# Patient Record
Sex: Female | Born: 1985 | Hispanic: Yes | Marital: Single | Smoking: Never smoker
Health system: Southern US, Community
[De-identification: ages and names within clinical notes are randomized; demographics above are authoritative.]

## PROBLEM LIST (undated history)

## (undated) ENCOUNTER — Inpatient Hospital Stay (HOSPITAL_COMMUNITY): Payer: Self-pay

## (undated) DIAGNOSIS — Z789 Other specified health status: Secondary | ICD-10-CM

## (undated) DIAGNOSIS — D649 Anemia, unspecified: Secondary | ICD-10-CM

## (undated) DIAGNOSIS — Z5189 Encounter for other specified aftercare: Secondary | ICD-10-CM

## (undated) HISTORY — DX: Anemia, unspecified: D64.9

## (undated) HISTORY — DX: Other specified health status: Z78.9

## (undated) HISTORY — PX: DILATION AND CURETTAGE OF UTERUS: SHX78

## (undated) HISTORY — DX: Encounter for other specified aftercare: Z51.89

---

## 2014-04-21 ENCOUNTER — Ambulatory Visit (INDEPENDENT_AMBULATORY_CARE_PROVIDER_SITE_OTHER): Payer: Medicaid Other | Admitting: Obstetrics & Gynecology

## 2014-04-21 ENCOUNTER — Encounter: Payer: Self-pay | Admitting: Obstetrics & Gynecology

## 2014-04-21 VITALS — BP 121/78 | HR 91 | Temp 97.9°F | Wt 183.0 lb

## 2014-04-21 DIAGNOSIS — Z3490 Encounter for supervision of normal pregnancy, unspecified, unspecified trimester: Secondary | ICD-10-CM

## 2014-04-21 DIAGNOSIS — Z23 Encounter for immunization: Secondary | ICD-10-CM

## 2014-04-21 DIAGNOSIS — Z3402 Encounter for supervision of normal first pregnancy, second trimester: Secondary | ICD-10-CM | POA: Insufficient documentation

## 2014-04-21 LAB — OB RESULTS CONSOLE GC/CHLAMYDIA
CHLAMYDIA, DNA PROBE: NEGATIVE
GC PROBE AMP, GENITAL: NEGATIVE

## 2014-04-21 NOTE — Addendum Note (Signed)
Addended by: Henriette CombsHATTON, Shanay Woolman L on: 04/21/2014 02:32 PM   Modules accepted: Orders

## 2014-04-21 NOTE — Progress Notes (Signed)
Subjective:    Grace ChouStephanie Paradis is being seen today for her first obstetrical visit.  This is not a planned pregnancy. She is at 6224w3d gestation.Relationship with FOB: significant other, living together. Patient does intend to breast feed. Pregnancy history fully reviewed.  The information documented in the HPI was reviewed and verified.  Menstrual History: OB History   Grav Para Term Preterm Abortions TAB SAB Ect Mult Living   2    1 1           Menarche age:    Patient's last menstrual period was 12/13/2013.    Past Medical History  Diagnosis Date  . Medical history non-contributory     Past Surgical History  Procedure Laterality Date  . No past surgeries       (Not in a hospital admission) No Known Allergies  History  Substance Use Topics  . Smoking status: Never Smoker   . Smokeless tobacco: Never Used  . Alcohol Use: No    History reviewed. No pertinent family history.   Review of Systems Constitutional: negative for weight loss Gastrointestinal: negative for vomiting Genitourinary:negative for genital lesions and vaginal discharge and dysuria Musculoskeletal:negative for back pain Behavioral/Psych: negative for abusive relationship, depression, illegal drug usage and tobacco use    Objective:    BP 121/78  Pulse 91  Temp(Src) 97.9 F (36.6 C)  Wt 83.008 kg (183 lb)  LMP 12/13/2013 General Appearance:    Alert, cooperative, no distress, appears stated age  Head:    Normocephalic, without obvious abnormality, atraumatic  Eyes:    PERRL, conjunctiva/corneas clear, EOM's intact, fundi    benign, both eyes  Ears:    Normal TM's and external ear canals, both ears  Nose:   Nares normal, septum midline, mucosa normal, no drainage    or sinus tenderness  Throat:   Lips, mucosa, and tongue normal; teeth and gums normal  Neck:   Supple, symmetrical, trachea midline, no adenopathy;    thyroid:  no enlargement/tenderness/nodules; no carotid   bruit or JVD  Back:      Symmetric, no curvature, ROM normal, no CVA tenderness  Lungs:     Clear to auscultation bilaterally, respirations unlabored  Chest Wall:    No tenderness or deformity   Heart:    Regular rate and rhythm, S1 and S2 normal, no murmur, rub   or gallop  Breast Exam:    No tenderness, masses, or nipple abnormality  Abdomen:     Soft, non-tender, bowel sounds active all four quadrants,    no masses, no organomegaly  Genitalia:    Normal female without lesion, discharge or tenderness  Extremities:   Extremities normal, atraumatic, no cyanosis or edema  Pulses:   2+ and symmetric all extremities  Skin:   Skin color, texture, turgor normal, no rashes or lesions  Lymph nodes:   Cervical, supraclavicular, and axillary nodes normal  Neurologic:   CNII-XII intact, normal strength, sensation and reflexes    throughout      Lab Review Urine pregnancy test Labs reviewed no Radiologic studies reviewed no Assessment:    Pregnancy at 7924w3d weeks    Plan:      Prenatal vitamins.  Counseling provided regarding continued use of seat belts, cessation of alcohol consumption, smoking or use of illicit drugs; infection precautions i.e., influenza/TDAP immunizations, toxoplasmosis,CMV, parvovirus, listeria and varicella; workplace safety, exercise during pregnancy; routine dental care, safe medications, sexual activity, hot tubs, saunas, pools, travel, caffeine use, fish and methlymercury, potential toxins,  hair treatments, varicose veins Weight gain recommendations per IOM guidelines reviewed: normal weight/BMI 18.5 - 24.9--> gain 25 - 35 lbs Problem list reviewed and updated. CF mutation testing/NIPT/QUAD SCREEN/fragile X/Spinal muscular atrophy discussed. Role of ultrasound in pregnancy discussed; fetal survey: ordered. Amniocentesis discussed: not indicated. VBAC calculator score: VBAC consent form provided Meds ordered this encounter  Medications  . Prenatal Multivit-Min-Fe-FA (PRENATAL VITAMINS  PO)    Sig: Take by mouth.   Orders Placed This Encounter  Procedures  . Culture, OB Urine  . Obstetric panel  . HIV antibody  . Hemoglobinopathy evaluation  . Varicella zoster antibody, IgG  . Vit D  25 hydroxy (rtn osteoporosis monitoring)  . TSH  . POCT urinalysis dipstick    Follow up in 4 weeks.

## 2014-04-21 NOTE — Addendum Note (Signed)
Addended by: Marya LandryFOSTER, Masayo Fera D on: 04/21/2014 05:24 PM   Modules accepted: Orders

## 2014-04-21 NOTE — Patient Instructions (Signed)
Prenatal Care  WHAT IS PRENATAL CARE?  Prenatal care means health care during your pregnancy, before your baby is born. It is very important to take care of yourself and your baby during your pregnancy by:   Getting early prenatal care. If you know you are pregnant, or think you might be pregnant, call your health care provider as soon as possible. Schedule a visit for a prenatal exam.  Getting regular prenatal care. Follow your health care provider's schedule for blood and other necessary tests. Do not miss appointments.  Doing everything you can to keep yourself and your baby healthy during your pregnancy.  Getting complete care. Prenatal care should include evaluation of the medical, dietary, educational, psychological, and social needs of you and your significant other. The medical and genetic history of your family and the family of your baby's father should be discussed with your health care provider.  Discussing with your health care provider:  Prescription, over-the-counter, and herbal medicines that you take.  Any history of substance abuse, alcohol use, smoking, and illegal drug use.  Any history of domestic abuse and violence.  Immunizations you have received.  Your nutrition and diet.  The amount of exercise you do.  Any environmental and occupational hazards to which you are exposed.  History of sexually transmitted infections for both you and your partner.  Previous pregnancies you have had. WHY IS PRENATAL CARE SO IMPORTANT?  By regularly seeing your health care provider, you help ensure that problems can be identified early so that they can be treated as soon as possible. Other problems might be prevented. Many studies have shown that early and regular prenatal care is important for the health of mothers and their babies.  HOW CAN I TAKE CARE OF MYSELF WHILE I AM PREGNANT?  Here are ways to take care of yourself and your baby:   Start or continue taking your  multivitamin with 400 micrograms (mcg) of folic acid every day.  Get early and regular prenatal care. It is very important to see a health care provider during your pregnancy. Your health care provider will check at each visit to make sure that you and your baby are healthy. If there are any problems, action can be taken right away to help you and your baby.  Eat a healthy diet that includes:  Fruits.  Vegetables.  Foods low in saturated fat.  Whole grains.  Calcium-rich foods, such as milk, yogurt, and hard cheeses.  Drink 6-8 glasses of liquids a day.  Unless your health care provider tells you not to, try to be physically active for 30 minutes, most days of the week. If you are pressed for time, you can get your activity in through 10-minute segments, three times a day.  Do not smoke, drink alcohol, or use drugs. These can cause long-term damage to your baby. Talk with your health care provider about steps to take to stop smoking. Talk with a member of your faith community, a counselor, a trusted friend, or your health care provider if you are concerned about your alcohol or drug use.  Ask your health care provider before taking any medicine, even over-the-counter medicines. Some medicines are not safe to take during pregnancy.  Get plenty of rest and sleep.  Avoid hot tubs and saunas during pregnancy.  Do not have X-rays taken unless absolutely necessary and with the recommendation of your health care provider. A lead shield can be placed on your abdomen to protect your baby when   X-rays are taken in other parts of your body.  Do not empty the cat litter when you are pregnant. It may contain a parasite that causes an infection called toxoplasmosis, which can cause birth defects. Also, use gloves when working in garden areas used by cats.  Do not eat uncooked or undercooked meats or fish.  Do not eat soft, mold-ripened cheeses (Brie, Camembert, and chevre) or soft, blue-veined  cheese (Danish blue and Roquefort).  Stay away from toxic chemicals like:  Insecticides.  Solvents (some cleaners or paint thinners).  Lead.  Mercury.  Sexual intercourse may continue until the end of the pregnancy, unless you have a medical problem or there is a problem with the pregnancy and your health care provider tells you not to.  Do not wear high-heel shoes, especially during the second half of the pregnancy. You can lose your balance and fall.  Do not take long trips, unless absolutely necessary. Be sure to see your health care provider before going on the trip.  Do not sit in one position for more than 2 hours when on a trip.  Take a copy of your medical records when going on a trip. Know where a hospital is located in the city you are visiting, in case of an emergency.  Most dangerous household products will have pregnancy warnings on their labels. Ask your health care provider about products if you are unsure.  Limit or eliminate your caffeine intake from coffee, tea, sodas, medicines, and chocolate.  Many women continue working through pregnancy. Staying active might help you stay healthier. If you have a question about the safety or the hours you work at your particular job, talk with your health care provider.  Get informed:  Read books.  Watch videos.  Go to childbirth classes for you and your significant other.  Talk with experienced moms.  Ask your health care provider about childbirth education classes for you and your partner. Classes can help you and your partner prepare for the birth of your baby.  Ask about a baby doctor (pediatrician) and methods and pain medicine for labor, delivery, and possible cesarean delivery. HOW OFTEN SHOULD I SEE MY HEALTH CARE PROVIDER DURING PREGNANCY?  Your health care provider will give you a schedule for your prenatal visits. You will have visits more often as you get closer to the end of your pregnancy. An average  pregnancy lasts about 40 weeks.  A typical schedule includes visiting your health care provider:   About once each month during your first 6 months of pregnancy.  Every 2 weeks during the next 2 months.  Weekly in the last month, until the delivery date. Your health care provider will probably want to see you more often if:  You are older than 35 years.  Your pregnancy is high risk because you have certain health problems or problems with the pregnancy, such as:  Diabetes.  High blood pressure.  The baby is not growing on schedule, according to the dates of the pregnancy. Your health care provider will do special tests to make sure you and your baby are not having any serious problems. WHAT HAPPENS DURING PRENATAL VISITS?   At your first prenatal visit, your health care provider will do a physical exam and talk to you about your health history and the health history of your partner and your family. Your health care provider will be able to tell you what date to expect your baby to be born on.  Your   first physical exam will include checks of your blood pressure, measurements of your height and weight, and an exam of your pelvic organs. Your health care provider will do a Pap test if you have not had one recently and will do cultures of your cervix to make sure there is no infection.  At each prenatal visit, there will be tests of your blood, urine, blood pressure, weight, and the progress of the baby will be checked.  At your later prenatal visits, your health care provider will check how you are doing and how your baby is developing. You may have a number of tests done as your pregnancy progresses.  Ultrasound exams are often used to check on your baby's growth and health.  You may have more urine and blood tests, as well as special tests, if needed. These may include amniocentesis to examine fluid in the pregnancy sac, stress tests to check how the baby responds to contractions, or a  biophysical profile to measure your baby's well-being. Your health care provider will explain the tests and why they are necessary.  You should be tested for high blood sugar (gestational diabetes) between the 24th and 28th weeks of your pregnancy.  You should discuss with your health care provider your plans to breastfeed or bottle-feed your baby.  Each visit is also a chance for you to learn about staying healthy during pregnancy and to ask questions. Document Released: 06/14/2003 Document Revised: 06/16/2013 Document Reviewed: 08/26/2013 ExitCare Patient Information 2015 [employer]. This information is not intended to replace advice given to you by your health care provider. Make sure you discuss any questions you have with your health care provider.  

## 2014-04-22 LAB — GC/CHLAMYDIA PROBE AMP
CT PROBE, AMP APTIMA: NEGATIVE
GC Probe RNA: NEGATIVE

## 2014-04-22 LAB — HIV ANTIBODY (ROUTINE TESTING W REFLEX): HIV: NONREACTIVE

## 2014-04-22 LAB — OBSTETRIC PANEL
Antibody Screen: NEGATIVE
BASOS PCT: 0 % (ref 0–1)
Basophils Absolute: 0 10*3/uL (ref 0.0–0.1)
EOS ABS: 0.1 10*3/uL (ref 0.0–0.7)
Eosinophils Relative: 1 % (ref 0–5)
HCT: 33.8 % — ABNORMAL LOW (ref 36.0–46.0)
HEMOGLOBIN: 11.7 g/dL — AB (ref 12.0–15.0)
Hepatitis B Surface Ag: NEGATIVE
Lymphocytes Relative: 16 % (ref 12–46)
Lymphs Abs: 1.5 10*3/uL (ref 0.7–4.0)
MCH: 29 pg (ref 26.0–34.0)
MCHC: 34.6 g/dL (ref 30.0–36.0)
MCV: 83.9 fL (ref 78.0–100.0)
MONOS PCT: 6 % (ref 3–12)
Monocytes Absolute: 0.6 10*3/uL (ref 0.1–1.0)
Neutro Abs: 7.2 10*3/uL (ref 1.7–7.7)
Neutrophils Relative %: 77 % (ref 43–77)
PLATELETS: 194 10*3/uL (ref 150–400)
RBC: 4.03 MIL/uL (ref 3.87–5.11)
RDW: 14.3 % (ref 11.5–15.5)
RH TYPE: POSITIVE
Rubella: 1.64 Index — ABNORMAL HIGH (ref <0.90)
WBC: 9.4 10*3/uL (ref 4.0–10.5)

## 2014-04-22 LAB — TSH: TSH: 1.409 u[IU]/mL (ref 0.350–4.500)

## 2014-04-22 LAB — VITAMIN D 25 HYDROXY (VIT D DEFICIENCY, FRACTURES): Vit D, 25-Hydroxy: 34 ng/mL (ref 30–89)

## 2014-04-22 LAB — VARICELLA ZOSTER ANTIBODY, IGG: Varicella IgG: 2800 Index — ABNORMAL HIGH (ref <135.00)

## 2014-04-23 LAB — HEMOGLOBINOPATHY EVALUATION
HEMOGLOBIN OTHER: 0 %
HGB A: 97.2 % (ref 96.8–97.8)
Hgb A2 Quant: 2.8 % (ref 2.2–3.2)
Hgb F Quant: 0 % (ref 0.0–2.0)
Hgb S Quant: 0 %

## 2014-04-23 LAB — POCT URINALYSIS DIPSTICK
Bilirubin, UA: NEGATIVE
Glucose, UA: NEGATIVE
Ketones, UA: NEGATIVE
Leukocytes, UA: NEGATIVE
NITRITE UA: NEGATIVE
PH UA: 7
Protein, UA: NEGATIVE
RBC UA: NEGATIVE
Spec Grav, UA: <=1.005
UROBILINOGEN UA: NEGATIVE

## 2014-04-23 LAB — CULTURE, OB URINE
Colony Count: NO GROWTH
ORGANISM ID, BACTERIA: NO GROWTH

## 2014-04-27 ENCOUNTER — Other Ambulatory Visit: Payer: Medicaid Other

## 2014-04-27 ENCOUNTER — Ambulatory Visit (INDEPENDENT_AMBULATORY_CARE_PROVIDER_SITE_OTHER): Payer: Medicaid Other

## 2014-04-27 ENCOUNTER — Encounter: Payer: Self-pay | Admitting: Obstetrics & Gynecology

## 2014-04-27 DIAGNOSIS — Z3402 Encounter for supervision of normal first pregnancy, second trimester: Secondary | ICD-10-CM

## 2014-04-27 LAB — US OB COMP + 14 WK

## 2014-04-27 NOTE — Addendum Note (Signed)
Addended by: Henriette CombsHATTON, Karis Emig L on: 04/27/2014 03:30 PM   Modules accepted: Orders, SmartSet

## 2014-04-28 LAB — AFP, QUAD SCREEN
AFP: 35.2 ng/mL
Age Alone: 1:976 {titer}
Curr Gest Age: 19.2 wks.days
Down Syndrome Scr Risk Est: 1:1690 {titer}
HCG TOTAL: 31.21 [IU]/mL
INH: 177.7 pg/mL
Interpretation-AFP: NEGATIVE
MOM FOR HCG: 1.76
MoM for AFP: 0.8
MoM for INH: 1.38
OPEN SPINA BIFIDA: NEGATIVE
TRI 18 SCR RISK EST: NEGATIVE
Trisomy 18 (Edward) Syndrome Interp.: 1:74700 {titer}
UE3 VALUE: 1.97 ng/mL
uE3 Mom: 1.09

## 2014-05-19 ENCOUNTER — Ambulatory Visit (INDEPENDENT_AMBULATORY_CARE_PROVIDER_SITE_OTHER): Payer: Medicaid Other | Admitting: Obstetrics & Gynecology

## 2014-05-19 ENCOUNTER — Encounter: Payer: Self-pay | Admitting: Obstetrics & Gynecology

## 2014-05-19 VITALS — BP 120/72 | HR 90 | Temp 99.0°F | Wt 190.8 lb

## 2014-05-19 DIAGNOSIS — Z3402 Encounter for supervision of normal first pregnancy, second trimester: Secondary | ICD-10-CM

## 2014-05-19 NOTE — Progress Notes (Signed)
Patient reports she is doing well without concern.

## 2014-05-21 LAB — POCT URINALYSIS DIPSTICK
Bilirubin, UA: NEGATIVE
Glucose, UA: NEGATIVE
Ketones, UA: NEGATIVE
Leukocytes, UA: NEGATIVE
Nitrite, UA: NEGATIVE
PH UA: 6
PROTEIN UA: NEGATIVE
RBC UA: NEGATIVE
SPEC GRAV UA: 1.015
Urobilinogen, UA: NEGATIVE

## 2014-05-24 NOTE — Progress Notes (Signed)
Subjective:    Grace ChouStephanie Vazquez is a 28 y.o. female being seen today for her obstetrical visit. She is at 2834w3d gestation. Patient reports: no complaints . Fetal movement: normal.  Problem List Items Addressed This Visit    None    Visit Diagnoses    Encounter for supervision of normal first pregnancy in second trimester    -  Primary    Relevant Orders       POCT urinalysis dipstick (Completed)      Patient Active Problem List   Diagnosis Date Noted  . Supervision of normal first pregnancy in second trimester 04/21/2014   Objective:    BP 120/72 mmHg  Pulse 90  Temp(Src) 99 F (37.2 C)  Wt 86.546 kg (190 lb 12.8 oz)  LMP 12/13/2013 FHT: 160 BPM  Uterine Size: size equals dates     Assessment:    Pregnancy @ 634w3d    Plan:    Labs, problem list reviewed and updated 2 hr GTT planned Follow up in 4 weeks.

## 2014-05-24 NOTE — Patient Instructions (Signed)
Glucose Tolerance Test This is a test to see how your body processes carbohydrates. This test is often done to check patients for diabetes or the possibility of developing it. PREPARATION FOR TEST You should have nothing to eat or drink 12 hours before the test. You will be given a form of sugar (glucose) and then blood samples will be drawn from your vein to determine the level of sugar in your blood. Alternatively, blood may be drawn from your finger for testing. You should not smoke or exercise during the test. NORMAL FINDINGS  Fasting: 70-115 mg/dL  30 minutes: less than 200 mg/dL  1 hour: less than 200 mg/dL  2 hours: less than 140 mg/dL  3 hours: 70-115 mg/dL  4 hours: 70-115 mg/dL Ranges for normal findings may vary among different laboratories and hospitals. You should always check with your doctor after having lab work or other tests done to discuss the meaning of your test results and whether your values are considered within normal limits. MEANING OF TEST Your caregiver will go over the test results with you and discuss the importance and meaning of your results, as well as treatment options and the need for additional tests. OBTAINING THE TEST RESULTS It is your responsibility to obtain your test results. Ask the lab or department performing the test when and how you will get your results. Document Released: 07/04/2004 Document Revised: 09/03/2011 Document Reviewed: 10/16/2013 ExitCare Patient Information 2015 [employer]. This information is not intended to replace advice given to you by your health care provider. Make sure you discuss any questions you have with your health care provider.  

## 2014-06-16 ENCOUNTER — Other Ambulatory Visit: Payer: Medicaid Other

## 2014-06-16 ENCOUNTER — Ambulatory Visit (INDEPENDENT_AMBULATORY_CARE_PROVIDER_SITE_OTHER): Payer: Medicaid Other | Admitting: Obstetrics & Gynecology

## 2014-06-16 ENCOUNTER — Encounter: Payer: Self-pay | Admitting: Obstetrics & Gynecology

## 2014-06-16 VITALS — BP 116/76 | HR 91 | Temp 98.1°F | Wt 197.0 lb

## 2014-06-16 DIAGNOSIS — Z3490 Encounter for supervision of normal pregnancy, unspecified, unspecified trimester: Secondary | ICD-10-CM

## 2014-06-16 DIAGNOSIS — Z3402 Encounter for supervision of normal first pregnancy, second trimester: Secondary | ICD-10-CM

## 2014-06-16 LAB — POCT URINALYSIS DIPSTICK
Bilirubin, UA: NEGATIVE
Blood, UA: NEGATIVE
Glucose, UA: NEGATIVE
Ketones, UA: NEGATIVE
LEUKOCYTES UA: NEGATIVE
Nitrite, UA: POSITIVE
PH UA: 8
PROTEIN UA: NEGATIVE
Spec Grav, UA: <=1.005
Urobilinogen, UA: NEGATIVE

## 2014-06-16 NOTE — Progress Notes (Signed)
Patient reports she has an area of concern on her hand.

## 2014-06-17 LAB — CBC
HCT: 34.3 % — ABNORMAL LOW (ref 36.0–46.0)
HEMOGLOBIN: 11.5 g/dL — AB (ref 12.0–15.0)
MCH: 28.9 pg (ref 26.0–34.0)
MCHC: 33.5 g/dL (ref 30.0–36.0)
MCV: 86.2 fL (ref 78.0–100.0)
MPV: 9.7 fL (ref 9.4–12.4)
PLATELETS: 190 10*3/uL (ref 150–400)
RBC: 3.98 MIL/uL (ref 3.87–5.11)
RDW: 14.4 % (ref 11.5–15.5)
WBC: 10.3 10*3/uL (ref 4.0–10.5)

## 2014-06-17 LAB — RPR

## 2014-06-17 LAB — GLUCOSE TOLERANCE, 2 HOURS W/ 1HR
GLUCOSE, FASTING: 70 mg/dL (ref 70–99)
Glucose, 1 hour: 108 mg/dL (ref 70–170)
Glucose, 2 hour: 92 mg/dL (ref 70–139)

## 2014-06-17 LAB — HIV ANTIBODY (ROUTINE TESTING W REFLEX): HIV 1&2 Ab, 4th Generation: NONREACTIVE

## 2014-06-18 LAB — URINE CULTURE: Colony Count: 70000

## 2014-06-18 NOTE — Progress Notes (Signed)
Subjective:    Grace Vazquez is a 28 y.o. female being seen today for her obstetrical visit. She is at 7345w3d gestation. Patient reports: no complaints . Fetal movement: normal.  Problem List Items Addressed This Visit    None    Visit Diagnoses    Encounter for supervision of normal first pregnancy in second trimester    -  Primary    Relevant Orders       Glucose Tolerance, 2 Hours w/1 Hour (Completed)       CBC (Completed)       HIV antibody (Completed)       RPR (Completed)       POCT urinalysis dipstick (Completed)       Urine culture (Completed)      Patient Active Problem List   Diagnosis Date Noted  . Supervision of normal first pregnancy in second trimester 04/21/2014   Objective:    BP 116/76 mmHg  Pulse 91  Temp(Src) 98.1 F (36.7 C)  Wt 89.359 kg (197 lb)  LMP 12/13/2013 FHT: 160 BPM  Uterine Size: size equals dates     Assessment:    Pregnancy @ 6645w3d    Plan:    Labs, problem list reviewed and updated Follow up in 4 weeks.

## 2014-06-18 NOTE — Patient Instructions (Signed)
Second Trimester of Pregnancy The second trimester is from week 13 through week 28, months 4 through 6. The second trimester is often a time when you feel your best. Your body has also adjusted to being pregnant, and you begin to feel better physically. Usually, morning sickness has lessened or quit completely, you may have more energy, and you may have an increase in appetite. The second trimester is also a time when the fetus is growing rapidly. At the end of the sixth month, the fetus is about 9 inches long and weighs about 1 pounds. You will likely begin to feel the baby move (quickening) between 18 and 20 weeks of the pregnancy. BODY CHANGES Your body goes through many changes during pregnancy. The changes vary from woman to woman.   Your weight will continue to increase. You will notice your lower abdomen bulging out.  You may begin to get stretch marks on your hips, abdomen, and breasts.  You may develop headaches that can be relieved by medicines approved by your health care provider.  You may urinate more often because the fetus is pressing on your bladder.  You may develop or continue to have heartburn as a result of your pregnancy.  You may develop constipation because certain hormones are causing the muscles that push waste through your intestines to slow down.  You may develop hemorrhoids or swollen, bulging veins (varicose veins).  You may have back pain because of the weight gain and pregnancy hormones relaxing your joints between the bones in your pelvis and as a result of a shift in weight and the muscles that support your balance.  Your breasts will continue to grow and be tender.  Your gums may bleed and may be sensitive to brushing and flossing.  Dark spots or blotches (chloasma, mask of pregnancy) may develop on your face. This will likely fade after the baby is born.  A dark line from your belly button to the pubic area (linea nigra) may appear. This will likely fade  after the baby is born.  You may have changes in your hair. These can include thickening of your hair, rapid growth, and changes in texture. Some women also have hair loss during or after pregnancy, or hair that feels dry or thin. Your hair will most likely return to normal after your baby is born. WHAT TO EXPECT AT YOUR PRENATAL VISITS During a routine prenatal visit:  You will be weighed to make sure you and the fetus are growing normally.  Your blood pressure will be taken.  Your abdomen will be measured to track your baby's growth.  The fetal heartbeat will be listened to.  Any test results from the previous visit will be discussed. Your health care provider may ask you:  How you are feeling.  If you are feeling the baby move.  If you have had any abnormal symptoms, such as leaking fluid, bleeding, severe headaches, or abdominal cramping.  If you have any questions. Other tests that may be performed during your second trimester include:  Blood tests that check for:  Low iron levels (anemia).  Gestational diabetes (between 24 and 28 weeks).  Rh antibodies.  Urine tests to check for infections, diabetes, or protein in the urine.  An ultrasound to confirm the proper growth and development of the baby.  An amniocentesis to check for possible genetic problems.  Fetal screens for spina bifida and Down syndrome. HOME CARE INSTRUCTIONS   Avoid all smoking, herbs, alcohol, and unprescribed   drugs. These chemicals affect the formation and growth of the baby.  Follow your health care provider's instructions regarding medicine use. There are medicines that are either safe or unsafe to take during pregnancy.  Exercise only as directed by your health care provider. Experiencing uterine cramps is a good sign to stop exercising.  Continue to eat regular, healthy meals.  Wear a good support bra for breast tenderness.  Do not use hot tubs, steam rooms, or saunas.  Wear your  seat belt at all times when driving.  Avoid raw meat, uncooked cheese, cat litter boxes, and soil used by cats. These carry germs that can cause birth defects in the baby.  Take your prenatal vitamins.  Try taking a stool softener (if your health care provider approves) if you develop constipation. Eat more high-fiber foods, such as fresh vegetables or fruit and whole grains. Drink plenty of fluids to keep your urine clear or pale yellow.  Take warm sitz baths to soothe any pain or discomfort caused by hemorrhoids. Use hemorrhoid cream if your health care provider approves.  If you develop varicose veins, wear support hose. Elevate your feet for 15 minutes, 3-4 times a day. Limit salt in your diet.  Avoid heavy lifting, wear low heel shoes, and practice good posture.  Rest with your legs elevated if you have leg cramps or low back pain.  Visit your dentist if you have not gone yet during your pregnancy. Use a soft toothbrush to brush your teeth and be gentle when you floss.  A sexual relationship may be continued unless your health care provider directs you otherwise.  Continue to go to all your prenatal visits as directed by your health care provider. SEEK MEDICAL CARE IF:   You have dizziness.  You have mild pelvic cramps, pelvic pressure, or nagging pain in the abdominal area.  You have persistent nausea, vomiting, or diarrhea.  You have a bad smelling vaginal discharge.  You have pain with urination. SEEK IMMEDIATE MEDICAL CARE IF:   You have a fever.  You are leaking fluid from your vagina.  You have spotting or bleeding from your vagina.  You have severe abdominal cramping or pain.  You have rapid weight gain or loss.  You have shortness of breath with chest pain.  You notice sudden or extreme swelling of your face, hands, ankles, feet, or legs.  You have not felt your baby move in over an hour.  You have severe headaches that do not go away with  medicine.  You have vision changes. Document Released: 06/05/2001 Document Revised: 06/16/2013 Document Reviewed: 08/12/2012 ExitCare Patient Information 2015 [employer]. This information is not intended to replace advice given to you by your health care provider. Make sure you discuss any questions you have with your health care provider.  

## 2014-06-21 ENCOUNTER — Encounter: Payer: Self-pay | Admitting: *Deleted

## 2014-06-22 ENCOUNTER — Encounter: Payer: Self-pay | Admitting: Obstetrics & Gynecology

## 2014-06-25 NOTE — L&D Delivery Note (Signed)
Delivery Note At 12:50 AM a viable female was delivered via Vaginal, Spontaneous Delivery (Presentation: Right Occiput Anterior).  APGAR: 6, 8; weight:  3742 grams  .   Placenta status: Intact, Spontaneous.  Cord: 3 vessels with the following complications: None.  Cord pH: 7.13  Anesthesia: Epidural  Episiotomy: None Lacerations: 2nd degree;Perineal Suture Repair: 2.0 3.0 chromic vicryl rapide Est. Blood Loss (mL): 350  Mom to postpartum.  Baby to Couplet care / Skin to Skin.  Roy Snuffer A 09/18/2014, 1:42 AM

## 2014-06-29 ENCOUNTER — Telehealth: Payer: Self-pay | Admitting: Obstetrics

## 2014-07-13 NOTE — Telephone Encounter (Signed)
[id number] - left vm for patient.brm

## 2014-07-13 NOTE — Telephone Encounter (Signed)
[id number] - No return calls from patient. brm °

## 2014-07-14 ENCOUNTER — Encounter: Payer: Self-pay | Admitting: Obstetrics

## 2014-07-14 ENCOUNTER — Encounter: Payer: Medicaid Other | Admitting: Obstetrics

## 2014-07-14 ENCOUNTER — Ambulatory Visit (INDEPENDENT_AMBULATORY_CARE_PROVIDER_SITE_OTHER): Payer: Medicaid Other | Admitting: Obstetrics

## 2014-07-14 ENCOUNTER — Telehealth: Payer: Self-pay | Admitting: *Deleted

## 2014-07-14 VITALS — BP 122/73 | HR 94 | Temp 98.1°F | Wt 208.0 lb

## 2014-07-14 DIAGNOSIS — Z3403 Encounter for supervision of normal first pregnancy, third trimester: Secondary | ICD-10-CM

## 2014-07-14 DIAGNOSIS — N390 Urinary tract infection, site not specified: Secondary | ICD-10-CM

## 2014-07-14 LAB — POCT URINALYSIS DIPSTICK
Bilirubin, UA: NEGATIVE
Blood, UA: NEGATIVE
Glucose, UA: NEGATIVE
Ketones, UA: NEGATIVE
Leukocytes, UA: NEGATIVE
NITRITE UA: POSITIVE
PH UA: 7
Spec Grav, UA: 1.01
Urobilinogen, UA: NEGATIVE

## 2014-07-14 MED ORDER — NITROFURANTOIN MONOHYD MACRO 100 MG PO CAPS: 100.0000 mg | ORAL_CAPSULE | Freq: Two times a day (BID) | ORAL | Status: DC

## 2014-07-14 NOTE — Progress Notes (Signed)
Subjective:    Grace ChouStephanie Stene is a 29 y.o. female being seen today for her obstetrical visit. She is at 1019w3d gestation. Patient reports no complaints. Fetal movement: normal.  Problem List Items Addressed This Visit    None    Visit Diagnoses    Encounter for supervision of normal first pregnancy in third trimester    -  Primary    Relevant Orders    POCT urinalysis dipstick (Completed)    Culture, OB Urine    UTI (lower urinary tract infection)        Relevant Medications    Nitrofurantoin monohyd macro (MACROBID) 100 mg po cap      Patient Active Problem List   Diagnosis Date Noted  . Supervision of normal first pregnancy in second trimester 04/21/2014   Objective:    BP 122/73 mmHg  Pulse 94  Temp(Src) 98.1 F (36.7 C)  Wt 208 lb (94.348 kg)  LMP 12/13/2013 FHT:  160 BPM  Uterine Size: size equals dates  Presentation: unsure     Assessment:    Pregnancy @ 5919w3d weeks   Plan:     labs reviewed, problem list updated Consent signed. GBS sent TDAP offered  Rhogam given for RH negative Pediatrician: discussed. Infant feeding: plans to breastfeed. Maternity leave: notdiscussed. Cigarette smoking: never smoked. Orders Placed This Encounter  Procedures  . Culture, OB Urine  . POCT urinalysis dipstick   Meds ordered this encounter  Medications  . nitrofurantoin, macrocrystal-monohydrate, (MACROBID) 100 MG capsule    Sig: Take 1 capsule (100 mg total) by mouth 2 (two) times daily.    Dispense:  14 capsule    Refill:  0   Follow up in 2 Weeks.

## 2014-07-14 NOTE — Telephone Encounter (Signed)
Call placed to pt making her aware that her U-Dip today showed that she has a UTI.  Pt made aware that Dr Clearance CootsHarper is sending antibiotic to her pharmacy. Pt advised to contact office if any problems with Rx.

## 2014-07-16 LAB — CULTURE, OB URINE
COLONY COUNT: NO GROWTH
ORGANISM ID, BACTERIA: NO GROWTH

## 2014-07-28 ENCOUNTER — Ambulatory Visit (INDEPENDENT_AMBULATORY_CARE_PROVIDER_SITE_OTHER): Payer: Medicaid Other | Admitting: Obstetrics

## 2014-07-28 VITALS — BP 118/74 | HR 94 | Temp 97.6°F | Wt 208.0 lb

## 2014-07-28 DIAGNOSIS — Z3403 Encounter for supervision of normal first pregnancy, third trimester: Secondary | ICD-10-CM

## 2014-07-28 LAB — POCT URINALYSIS DIPSTICK
Bilirubin, UA: NEGATIVE
Glucose, UA: NEGATIVE
KETONES UA: NEGATIVE
Leukocytes, UA: NEGATIVE
NITRITE UA: NEGATIVE
RBC UA: NEGATIVE
SPEC GRAV UA: 1.02
Urobilinogen, UA: NEGATIVE
pH, UA: 6

## 2014-07-29 ENCOUNTER — Encounter: Payer: Self-pay | Admitting: Obstetrics

## 2014-07-29 NOTE — Progress Notes (Signed)
Subjective:    Alvina ChouStephanie Champagne is a 29 y.o. female being seen today for her obstetrical visit. She is at 6080w4d gestation. Patient reports no complaints. Fetal movement: normal.  Problem List Items Addressed This Visit    None    Visit Diagnoses    Encounter for supervision of normal first pregnancy in third trimester    -  Primary    Relevant Orders    POCT urinalysis dipstick (Completed)      Patient Active Problem List   Diagnosis Date Noted  . Supervision of normal first pregnancy in second trimester 04/21/2014   Objective:    BP 118/74 mmHg  Pulse 94  Temp(Src) 97.6 F (36.4 C)  Wt 208 lb (94.348 kg)  LMP 12/13/2013 FHT:  160 BPM  Uterine Size: size equals dates  Presentation: unsure     Assessment:    Pregnancy @ 5180w4d weeks   Plan:     labs reviewed, problem list updated Consent signed. GBS sent TDAP offered  Rhogam given for RH negative Pediatrician: discussed. Infant feeding: plans to breastfeed. Maternity leave: discussed. Cigarette smoking: never smoked. Orders Placed This Encounter  Procedures  . POCT urinalysis dipstick   No orders of the defined types were placed in this encounter.   Follow up in 2 Weeks.

## 2014-08-09 ENCOUNTER — Encounter (HOSPITAL_COMMUNITY): Payer: Self-pay

## 2014-08-09 ENCOUNTER — Inpatient Hospital Stay (HOSPITAL_COMMUNITY)
Admission: AD | Admit: 2014-08-09 | Discharge: 2014-08-09 | Disposition: A | Discharge status: Home / Self Care | Payer: BC Managed Care – PPO | Source: Ambulatory Visit | Attending: Obstetrics | Admitting: Obstetrics

## 2014-08-09 DIAGNOSIS — O99613 Diseases of the digestive system complicating pregnancy, third trimester: Secondary | ICD-10-CM | POA: Diagnosis not present

## 2014-08-09 DIAGNOSIS — O9989 Other specified diseases and conditions complicating pregnancy, childbirth and the puerperium: Secondary | ICD-10-CM | POA: Insufficient documentation

## 2014-08-09 DIAGNOSIS — Z3A34 34 weeks gestation of pregnancy: Secondary | ICD-10-CM | POA: Insufficient documentation

## 2014-08-09 DIAGNOSIS — K529 Noninfective gastroenteritis and colitis, unspecified: Secondary | ICD-10-CM

## 2014-08-09 DIAGNOSIS — O212 Late vomiting of pregnancy: Secondary | ICD-10-CM | POA: Insufficient documentation

## 2014-08-09 DIAGNOSIS — A084 Viral intestinal infection, unspecified: Secondary | ICD-10-CM | POA: Insufficient documentation

## 2014-08-09 LAB — URINALYSIS, ROUTINE W REFLEX MICROSCOPIC
Bilirubin Urine: NEGATIVE
GLUCOSE, UA: NEGATIVE mg/dL
Hgb urine dipstick: NEGATIVE
Ketones, ur: 40 mg/dL — AB
NITRITE: NEGATIVE
PH: 6 (ref 5.0–8.0)
PROTEIN: NEGATIVE mg/dL
SPECIFIC GRAVITY, URINE: 1.025 (ref 1.005–1.030)
Urobilinogen, UA: 0.2 mg/dL (ref 0.0–1.0)

## 2014-08-09 LAB — URINE MICROSCOPIC-ADD ON

## 2014-08-09 MED ORDER — PROMETHAZINE HCL 25 MG PO TABS: 25.0000 mg | ORAL_TABLET | Freq: Four times a day (QID) | ORAL | Status: DC | PRN

## 2014-08-09 MED ORDER — LACTATED RINGERS IV BOLUS (SEPSIS)
1000.0000 mL | Freq: Once | INTRAVENOUS | Status: AC
  Administered 2014-08-09: 1000 mL via INTRAVENOUS

## 2014-08-09 MED ORDER — DEXTROSE 5 % IN LACTATED RINGERS IV BOLUS
1000.0000 mL | Freq: Once | INTRAVENOUS | Status: AC
  Administered 2014-08-09: 1000 mL via INTRAVENOUS

## 2014-08-09 MED ORDER — ONDANSETRON HCL 4 MG/2ML IJ SOLN
4.0000 mg | Freq: Once | INTRAMUSCULAR | Status: AC
  Administered 2014-08-09: 4 mg via INTRAVENOUS
  Filled 2014-08-09: qty 2

## 2014-08-09 NOTE — MAU Note (Signed)
Pt C/O 4-5 loose stools today, vomited 3 times.  Denies fever.  Bilateral side pain, denies uc's, bleeding, or LOF.

## 2014-08-09 NOTE — Discharge Instructions (Signed)

## 2014-08-09 NOTE — MAU Note (Signed)
Pt states since 0700 has vomited x3. Nauseated. Also has had diarrhea x5. Denies bleeding or abnormal vaginal discharge.

## 2014-08-09 NOTE — MAU Provider Note (Signed)
History     CSN: [id number]  Arrival date and time: 08/09/14 1712   First Provider Initiated Contact with Patient 08/09/14 1906      Chief Complaint  Patient presents with  . Emesis  . Diarrhea   HPI   Ms.Grace Vazquez is a 29 y.o. female G2P0010 at [redacted]w[redacted]d who presents with N/V/D. Symptoms started this morning and the patient has had vomiting and diarrhea throughout the day. She has been unable to hold down fluids.  She has had abdominal cramping since the vomiting started.   OB History    Gravida Para Term Preterm AB TAB SAB Ectopic Multiple Living   Past Medical History  Diagnosis Date  . Medical history non-contributory     Past Surgical History  Procedure Laterality Date  . Dilation and curettage of uterus      History reviewed. No pertinent family history.  History  Substance Use Topics  . Smoking status: Never Smoker   . Smokeless tobacco: Never Used  . Alcohol Use: No    Allergies: No Known Allergies  Prescriptions prior to admission  Medication Sig Dispense Refill Last Dose  . Prenatal Multivit-Min-Fe-FA (PRENATAL VITAMINS PO) Take 1 tablet by mouth daily.    08/09/2014 at 0800  . nitrofurantoin, macrocrystal-monohydrate, (MACROBID) 100 MG capsule Take 1 capsule (100 mg total) by mouth 2 (two) times daily. (Patient not taking: Reported on 08/09/2014) 14 capsule 0     Results for orders placed or performed during the hospital encounter of 08/09/14 (from the past 48 hour(s))  Urinalysis, Routine w reflex microscopic     Status: Abnormal   Collection Time: 08/09/14  5:30 PM  Result Value Ref Range   Color, Urine YELLOW YELLOW   APPearance HAZY (A) CLEAR   Specific Gravity, Urine 1.025 1.005 - 1.030   pH 6.0 5.0 - 8.0   Glucose, UA NEGATIVE NEGATIVE mg/dL   Hgb urine dipstick NEGATIVE NEGATIVE   Bilirubin Urine NEGATIVE NEGATIVE   Ketones, ur 40 (A) NEGATIVE mg/dL   Protein, ur NEGATIVE NEGATIVE mg/dL   Urobilinogen, UA 0.2 0.0 -  1.0 mg/dL   Nitrite NEGATIVE NEGATIVE   Leukocytes, UA MODERATE (A) NEGATIVE  Urine microscopic-add on     Status: Abnormal   Collection Time: 08/09/14  5:30 PM  Result Value Ref Range   Squamous Epithelial / LPF MANY (A) RARE   WBC, UA 3-6 <3 WBC/hpf   Bacteria, UA MANY (A) RARE    Review of Systems  Constitutional: Positive for chills. Negative for fever.  Gastrointestinal: Positive for nausea, vomiting, abdominal pain and diarrhea.  Genitourinary: Negative for dysuria, urgency and frequency.   Physical Exam   Blood pressure 121/63, pulse 132, temperature 97.6 F (36.4 C), temperature source Oral, resp. rate 18, last menstrual period 12/13/2013, SpO2 100 %.  Physical Exam  Constitutional: She is oriented to person, place, and time. She appears well-developed and well-nourished. No distress.  HENT:  Head: Normocephalic.  Eyes: Pupils are equal, round, and reactive to light.  Neck: Neck supple.  Respiratory: Effort normal.  Musculoskeletal: Normal range of motion.  Neurological: She is alert and oriented to person, place, and time.  Skin: Skin is warm. She is not diaphoretic.  Psychiatric: Her behavior is normal.   Fetal Tracing: Baseline: 150 bpm  Variability: Moderate  Accelerations: 15x15 Decelerations: None Toco: Contractions stopped with fluid bolus Baby initially tachy, following fluids baseline 150;s  Dilation: Closed Cervical Position: Posterior Exam by:: J jennifer rasch NP    MAU Course  Procedures  None  MDM D5LR bolus LR bolus Zofran 4 mg  1900: pulse 108 UA Urine culture   Assessment and Plan   A:  Viral gastroenteritis   P:  Discharge home in stable condition RX: Phenergan Follow up with Dr. Clearance CootsHarper if not feeling better tomorrow BRAT diet Increase fluids  Kick counts   Iona HansenJennifer Irene Rasch, NP 08/09/2014 7:54 PM

## 2014-08-12 ENCOUNTER — Ambulatory Visit (INDEPENDENT_AMBULATORY_CARE_PROVIDER_SITE_OTHER): Payer: BLUE CROSS/BLUE SHIELD | Admitting: Obstetrics

## 2014-08-12 ENCOUNTER — Encounter: Payer: BLUE CROSS/BLUE SHIELD | Admitting: Obstetrics

## 2014-08-12 VITALS — BP 115/70 | HR 85 | Temp 97.5°F | Wt 212.0 lb

## 2014-08-12 DIAGNOSIS — Z3403 Encounter for supervision of normal first pregnancy, third trimester: Secondary | ICD-10-CM

## 2014-08-12 LAB — POCT URINALYSIS DIPSTICK
Bilirubin, UA: NEGATIVE
Blood, UA: NEGATIVE
GLUCOSE UA: NEGATIVE
Ketones, UA: NEGATIVE
NITRITE UA: NEGATIVE
PROTEIN UA: NEGATIVE
SPEC GRAV UA: 1.01
Urobilinogen, UA: NEGATIVE
pH, UA: 7

## 2014-08-13 ENCOUNTER — Encounter (HOSPITAL_COMMUNITY): Payer: Self-pay | Admitting: *Deleted

## 2014-08-13 ENCOUNTER — Encounter: Payer: Self-pay | Admitting: Obstetrics

## 2014-08-13 ENCOUNTER — Inpatient Hospital Stay (HOSPITAL_COMMUNITY)
Admission: AD | Admit: 2014-08-13 | Discharge: 2014-08-13 | DRG: 781 | Disposition: A | Discharge status: Home / Self Care | Payer: No Typology Code available for payment source | Source: Ambulatory Visit | Attending: Obstetrics | Admitting: Obstetrics

## 2014-08-13 DIAGNOSIS — Y92410 Unspecified street and highway as the place of occurrence of the external cause: Secondary | ICD-10-CM | POA: Diagnosis not present

## 2014-08-13 DIAGNOSIS — O9989 Other specified diseases and conditions complicating pregnancy, childbirth and the puerperium: Secondary | ICD-10-CM | POA: Diagnosis present

## 2014-08-13 DIAGNOSIS — Z3A35 35 weeks gestation of pregnancy: Secondary | ICD-10-CM | POA: Diagnosis present

## 2014-08-13 DIAGNOSIS — Z3402 Encounter for supervision of normal first pregnancy, second trimester: Secondary | ICD-10-CM

## 2014-08-13 MED ORDER — TERBUTALINE SULFATE 1 MG/ML IJ SOLN
0.2500 mg | Freq: Once | INTRAMUSCULAR | Status: AC
  Administered 2014-08-13: 0.25 mg via SUBCUTANEOUS
  Filled 2014-08-13: qty 1

## 2014-08-13 NOTE — MAU Note (Signed)
Labor and delivery will triage/monitor pt for us.

## 2014-08-13 NOTE — Progress Notes (Signed)
Subjective:    Alvina ChouStephanie Oestreicher is a 29 y.o. female being seen today for her obstetrical visit. She is at 3666w5d gestation. Patient reports no complaints. Fetal movement: normal.  Problem List Items Addressed This Visit    None    Visit Diagnoses    Encounter for supervision of normal first pregnancy in third trimester    -  Primary    Relevant Orders    POCT urinalysis dipstick (Completed)      Patient Active Problem List   Diagnosis Date Noted  . Supervision of normal first pregnancy in second trimester 04/21/2014   Objective:    BP 115/70 mmHg  Pulse 85  Temp(Src) 97.5 F (36.4 C)  Wt 212 lb (96.163 kg)  LMP 12/13/2013 FHT:  160 BPM  Uterine Size: size equals dates  Presentation: cephalic     Assessment:    Pregnancy @ 3066w5d weeks   Plan:     labs reviewed, problem list updated Consent signed. GBS sent TDAP offered  Rhogam given for RH negative Pediatrician: discussed. Infant feeding: plans to breastfeed. Maternity leave: discussed. Cigarette smoking: never smoked. Orders Placed This Encounter  Procedures  . POCT urinalysis dipstick   No orders of the defined types were placed in this encounter.   Follow up in 1 Week.
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
## 2014-08-13 NOTE — Progress Notes (Signed)
Pt dc'd to home with significant other and family member. Pt without s/s of distress and without c/o pain.
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
## 2014-08-19 ENCOUNTER — Ambulatory Visit (INDEPENDENT_AMBULATORY_CARE_PROVIDER_SITE_OTHER): Payer: BLUE CROSS/BLUE SHIELD | Admitting: Obstetrics

## 2014-08-19 VITALS — BP 127/75 | HR 98 | Temp 97.7°F | Wt 213.0 lb

## 2014-08-19 DIAGNOSIS — Z3403 Encounter for supervision of normal first pregnancy, third trimester: Secondary | ICD-10-CM

## 2014-08-19 LAB — POCT URINALYSIS DIPSTICK
Bilirubin, UA: NEGATIVE
Blood, UA: NEGATIVE
GLUCOSE UA: NEGATIVE
KETONES UA: NEGATIVE
NITRITE UA: NEGATIVE
PH UA: 7
Protein, UA: NEGATIVE
SPEC GRAV UA: 1.01
Urobilinogen, UA: NEGATIVE

## 2014-08-20 ENCOUNTER — Encounter: Payer: Self-pay | Admitting: Obstetrics

## 2014-08-20 NOTE — Progress Notes (Signed)
Subjective:    Grace ChouStephanie Vazquez is a 29 y.o. female being seen today for her obstetrical visit. She is at 1958w5d gestation. Patient reports no complaints. Fetal movement: normal.  Problem List Items Addressed This Visit    None    Visit Diagnoses    Encounter for supervision of normal first pregnancy in third trimester    -  Primary    Relevant Orders    POCT urinalysis dipstick (Completed)    Strep B DNA probe      Patient Active Problem List   Diagnosis Date Noted  . Supervision of normal first pregnancy in second trimester 04/21/2014   Objective:    BP 127/75 mmHg  Pulse 98  Temp(Src) 97.7 F (36.5 C)  Wt 213 lb (96.616 kg)  LMP 12/13/2013 FHT:  160 BPM  Uterine Size: size equals dates  Presentation: unsure     Assessment:    Pregnancy @ 2658w5d weeks   Plan:     labs reviewed, problem list updated Consent signed. GBS sent TDAP offered  Rhogam given for RH negative Pediatrician: discussed. Infant feeding: plans to breastfeed. Maternity leave: discussed. Cigarette smoking: never smoked. Orders Placed This Encounter  Procedures  . Strep B DNA probe  . POCT urinalysis dipstick   No orders of the defined types were placed in this encounter.   Follow up in 1 Week.

## 2014-08-21 LAB — STREP B DNA PROBE: STREP GROUP B AG: NOT DETECTED

## 2014-08-23 ENCOUNTER — Telehealth: Payer: Self-pay

## 2014-08-23 NOTE — Telephone Encounter (Signed)
FMLA papers, 15.00 charge - left vm with patient to pick-up

## 2014-08-24 ENCOUNTER — Ambulatory Visit (INDEPENDENT_AMBULATORY_CARE_PROVIDER_SITE_OTHER): Payer: Self-pay | Admitting: Obstetrics

## 2014-08-24 VITALS — BP 119/78 | HR 108 | Temp 98.5°F | Wt 213.0 lb

## 2014-08-24 DIAGNOSIS — Z3403 Encounter for supervision of normal first pregnancy, third trimester: Secondary | ICD-10-CM

## 2014-08-24 DIAGNOSIS — Z029 Encounter for administrative examinations, unspecified: Secondary | ICD-10-CM

## 2014-08-24 LAB — POCT URINALYSIS DIPSTICK
Bilirubin, UA: NEGATIVE
Glucose, UA: NEGATIVE
KETONES UA: NEGATIVE
Nitrite, UA: NEGATIVE
PH UA: 7
RBC UA: NEGATIVE
SPEC GRAV UA: 1.01
Urobilinogen, UA: NEGATIVE

## 2014-08-25 ENCOUNTER — Encounter: Payer: Self-pay | Admitting: Obstetrics

## 2014-08-25 NOTE — Progress Notes (Signed)
Subjective:    Grace ChouStephanie Vazquez is a 29 y.o. female being seen today for her obstetrical visit. She is at 578w3d gestation. Patient reports no complaints. Fetal movement: normal.  Problem List Items Addressed This Visit    None    Visit Diagnoses    Encounter for supervision of normal first pregnancy in third trimester    -  Primary    Relevant Orders    POCT urinalysis dipstick (Completed)      Patient Active Problem List   Diagnosis Date Noted  . Supervision of normal first pregnancy in second trimester 04/21/2014   Objective:    BP 119/78 mmHg  Pulse 108  Temp(Src) 98.5 F (36.9 C)  Wt 213 lb (96.616 kg)  LMP 12/13/2013 FHT:  150 BPM  Uterine Size: size equals dates  Presentation: unsure     Assessment:    Pregnancy @ 4278w3d weeks   Plan:     labs reviewed, problem list updated Consent signed. GBS sent TDAP offered  Rhogam given for RH negative Pediatrician: discussed. Infant feeding: plans to breastfeed. Maternity leave: discussed. Cigarette smoking: never smoked. Orders Placed This Encounter  Procedures  . POCT urinalysis dipstick   No orders of the defined types were placed in this encounter.   Follow up in 1 Week.

## 2014-08-31 ENCOUNTER — Encounter: Payer: Self-pay | Admitting: Obstetrics

## 2014-08-31 ENCOUNTER — Ambulatory Visit (INDEPENDENT_AMBULATORY_CARE_PROVIDER_SITE_OTHER): Payer: BLUE CROSS/BLUE SHIELD | Admitting: Obstetrics

## 2014-08-31 VITALS — BP 119/75 | HR 98 | Temp 97.2°F | Wt 218.0 lb

## 2014-08-31 DIAGNOSIS — Z3403 Encounter for supervision of normal first pregnancy, third trimester: Secondary | ICD-10-CM

## 2014-08-31 LAB — POCT URINALYSIS DIPSTICK
Bilirubin, UA: NEGATIVE
Blood, UA: NEGATIVE
GLUCOSE UA: NEGATIVE
KETONES UA: NEGATIVE
Nitrite, UA: NEGATIVE
PH UA: 7
Protein, UA: NEGATIVE
Spec Grav, UA: 1.015
Urobilinogen, UA: NEGATIVE

## 2014-08-31 NOTE — Progress Notes (Signed)
Subjective:    Grace ChouStephanie Vazquez is a 29 y.o. female being seen today for her obstetrical visit. She is at 6862w2d gestation. Patient reports no complaints. Fetal movement: normal.  Problem List Items Addressed This Visit    None    Visit Diagnoses    Encounter for supervision of normal first pregnancy in third trimester    -  Primary    Relevant Orders    POCT urinalysis dipstick (Completed)      Patient Active Problem List   Diagnosis Date Noted  . Supervision of normal first pregnancy in second trimester 04/21/2014    Objective:    BP 119/75 mmHg  Pulse 98  Temp(Src) 97.2 F (36.2 C)  Wt 218 lb (98.884 kg)  LMP 12/13/2013 FHT: 150 BPM  Uterine Size: size equals dates  Presentations: unsure  Pelvic Exam: Deferred    Assessment:    Pregnancy @ 2962w2d weeks   Plan:   Plans for delivery: Vaginal anticipated; labs reviewed; problem list updated Counseling: Consent signed. Infant feeding: plans to breastfeed. Cigarette smoking: never smoked. L&D discussion: symptoms of labor, discussed when to call, discussed what number to call, anesthetic/analgesic options reviewed and delivering clinician:  plans Physician. Postpartum supports and preparation: circumcision discussed and contraception plans discussed.  Follow up in 1 Week.

## 2014-09-09 ENCOUNTER — Ambulatory Visit (INDEPENDENT_AMBULATORY_CARE_PROVIDER_SITE_OTHER): Payer: BC Managed Care – PPO | Admitting: Obstetrics

## 2014-09-09 ENCOUNTER — Encounter: Payer: Self-pay | Admitting: Obstetrics

## 2014-09-09 VITALS — BP 118/74 | HR 80 | Temp 97.9°F | Wt 223.0 lb

## 2014-09-09 DIAGNOSIS — Z3403 Encounter for supervision of normal first pregnancy, third trimester: Secondary | ICD-10-CM

## 2014-09-09 LAB — POCT URINALYSIS DIPSTICK
Bilirubin, UA: NEGATIVE
Blood, UA: NEGATIVE
GLUCOSE UA: NEGATIVE
KETONES UA: NEGATIVE
Nitrite, UA: NEGATIVE
PH UA: 7
Spec Grav, UA: 1.015
Urobilinogen, UA: NEGATIVE

## 2014-09-09 NOTE — Progress Notes (Signed)
Subjective:    Grace Vazquez is a 29 y.o. female being seen today for her obstetrical visit. She is at 5977w4d gestation. Patient reports no complaints. Fetal movement: normal.  Problem List Items Addressed This Visit    None    Visit Diagnoses    Encounter for supervision of normal first pregnancy in third trimester    -  Primary    Relevant Orders    POCT urinalysis dipstick (Completed)      Patient Active Problem List   Diagnosis Date Noted  . Supervision of normal first pregnancy in second trimester 04/21/2014    Objective:    BP 118/74 mmHg  Pulse 80  Temp(Src) 97.9 F (36.6 C)  Wt 223 lb (101.152 kg)  LMP 12/13/2013 FHT: 160 BPM  Uterine Size: size equals dates  Presentations: unsure  Pelvic Exam: Deferred    Assessment:    Pregnancy @ 4477w4d weeks   Plan:   Plans for delivery: Vaginal anticipated; labs reviewed; problem list updated Counseling: Consent signed. Infant feeding: plans to breastfeed. Cigarette smoking: never smoked. L&D discussion: symptoms of labor, discussed when to call, discussed what number to call, anesthetic/analgesic options reviewed and delivering clinician:  plans Physician. Postpartum supports and preparation: circumcision discussed and contraception plans discussed.  Follow up in 1 Week.

## 2014-09-16 ENCOUNTER — Inpatient Hospital Stay (HOSPITAL_COMMUNITY)
Admission: AD | Admit: 2014-09-16 | Discharge: 2014-09-16 | Disposition: A | Discharge status: Home / Self Care | Payer: BC Managed Care – PPO | Source: Ambulatory Visit | Attending: Obstetrics | Admitting: Obstetrics

## 2014-09-16 ENCOUNTER — Encounter: Payer: Self-pay | Admitting: Obstetrics

## 2014-09-16 ENCOUNTER — Encounter (HOSPITAL_COMMUNITY): Payer: Self-pay | Admitting: *Deleted

## 2014-09-16 ENCOUNTER — Ambulatory Visit (INDEPENDENT_AMBULATORY_CARE_PROVIDER_SITE_OTHER): Payer: BC Managed Care – PPO | Admitting: Obstetrics

## 2014-09-16 VITALS — BP 133/86 | HR 83 | Temp 98.2°F | Wt 225.0 lb

## 2014-09-16 DIAGNOSIS — Z3403 Encounter for supervision of normal first pregnancy, third trimester: Secondary | ICD-10-CM | POA: Diagnosis not present

## 2014-09-16 DIAGNOSIS — Z3A40 40 weeks gestation of pregnancy: Secondary | ICD-10-CM | POA: Insufficient documentation

## 2014-09-16 LAB — POCT URINALYSIS DIPSTICK
Bilirubin, UA: NEGATIVE
Blood, UA: 50
Glucose, UA: NEGATIVE
Ketones, UA: NEGATIVE
Nitrite, UA: NEGATIVE
PH UA: 7
PROTEIN UA: NEGATIVE
SPEC GRAV UA: 1.01
Urobilinogen, UA: NEGATIVE

## 2014-09-16 NOTE — Discharge Instructions (Signed)
Pt was given Labor precautions. Pt verbalized understanding of dc instructions.

## 2014-09-16 NOTE — MAU Note (Signed)
Patient presents to MAU with c/o irregular contractions; states "off and on". The contractions have gotten worse since last night around 1am. Having some red/orange bleeding per patient; nothing noted on pad at this time. Denies LOF. +FM

## 2014-09-16 NOTE — Progress Notes (Signed)
Subjective:    Alvina ChouStephanie Kiner is a 29 y.o. female being seen today for her obstetrical visit. She is at 6050w4d gestation. Patient reports occasional contractions. Fetal movement: normal.  Problem List Items Addressed This Visit    None    Visit Diagnoses    Encounter for supervision of normal first pregnancy in third trimester    -  Primary    Relevant Orders    POCT urinalysis dipstick      Patient Active Problem List   Diagnosis Date Noted  . Supervision of normal first pregnancy in second trimester 04/21/2014    Objective:    BP 133/86 mmHg  Pulse 83  Temp(Src) 98.2 F (36.8 C)  Wt 225 lb (102.059 kg)  LMP 12/13/2013 FHT: 150 BPM  Uterine Size: size equals dates  Presentations: cephalic  Pelvic Exam: Deferred    Assessment:    Pregnancy @ 7650w4d weeks   Plan:   Plans for delivery: Vaginal anticipated; labs reviewed; problem list updated Counseling: Consent signed. Infant feeding: plans to breastfeed. Cigarette smoking: never smoked. L&D discussion: symptoms of labor, discussed when to call, discussed what number to call, anesthetic/analgesic options reviewed and delivering clinician:  plans Physician. Postpartum supports and preparation: circumcision discussed and contraception plans discussed.  Follow up in 1 Week.

## 2014-09-17 ENCOUNTER — Inpatient Hospital Stay (HOSPITAL_COMMUNITY): Payer: BC Managed Care – PPO | Admitting: Anesthesiology

## 2014-09-17 ENCOUNTER — Inpatient Hospital Stay (HOSPITAL_COMMUNITY)
Admission: AD | Admit: 2014-09-17 | Discharge: 2014-09-20 | DRG: 775 | Disposition: A | Discharge status: Home / Self Care | Payer: BC Managed Care – PPO | Source: Ambulatory Visit | Attending: Obstetrics | Admitting: Obstetrics

## 2014-09-17 ENCOUNTER — Encounter (HOSPITAL_COMMUNITY): Payer: Self-pay

## 2014-09-17 ENCOUNTER — Inpatient Hospital Stay (HOSPITAL_COMMUNITY): Payer: BC Managed Care – PPO

## 2014-09-17 DIAGNOSIS — O4290 Premature rupture of membranes, unspecified as to length of time between rupture and onset of labor, unspecified weeks of gestation: Secondary | ICD-10-CM | POA: Diagnosis present

## 2014-09-17 DIAGNOSIS — Z3A39 39 weeks gestation of pregnancy: Secondary | ICD-10-CM | POA: Insufficient documentation

## 2014-09-17 DIAGNOSIS — Z3402 Encounter for supervision of normal first pregnancy, second trimester: Secondary | ICD-10-CM

## 2014-09-17 DIAGNOSIS — Z3483 Encounter for supervision of other normal pregnancy, third trimester: Secondary | ICD-10-CM | POA: Diagnosis present

## 2014-09-17 LAB — TYPE AND SCREEN
ABO/RH(D): O POS
Antibody Screen: NEGATIVE

## 2014-09-17 LAB — SYPHILIS: RPR W/REFLEX TO RPR TITER AND TREPONEMAL ANTIBODIES, TRADITIONAL SCREENING AND DIAGNOSIS ALGORITHM: RPR Ser Ql: NONREACTIVE

## 2014-09-17 LAB — CBC
HCT: 34 % — ABNORMAL LOW (ref 36.0–46.0)
Hemoglobin: 11.4 g/dL — ABNORMAL LOW (ref 12.0–15.0)
MCH: 25.9 pg — ABNORMAL LOW (ref 26.0–34.0)
MCHC: 33.5 g/dL (ref 30.0–36.0)
MCV: 77.1 fL — ABNORMAL LOW (ref 78.0–100.0)
Platelets: 230 K/uL (ref 150–400)
RBC: 4.41 MIL/uL (ref 3.87–5.11)
RDW: 15 % (ref 11.5–15.5)
WBC: 13.3 K/uL — ABNORMAL HIGH (ref 4.0–10.5)

## 2014-09-17 LAB — ABO/RH: ABO/RH(D): O POS

## 2014-09-17 MED ORDER — OXYTOCIN 40 UNITS IN LACTATED RINGERS INFUSION - SIMPLE MED
1.0000 m[IU]/min | INTRAVENOUS | Status: DC
  Administered 2014-09-17: 1 m[IU]/min via INTRAVENOUS
  Filled 2014-09-17: qty 1000

## 2014-09-17 MED ORDER — OXYTOCIN 40 UNITS IN LACTATED RINGERS INFUSION - SIMPLE MED
62.5000 mL/h | INTRAVENOUS | Status: DC
  Filled 2014-09-17: qty 1000

## 2014-09-17 MED ORDER — LIDOCAINE HCL (PF) 1 % IJ SOLN
30.0000 mL | INTRAMUSCULAR | Status: AC | PRN
  Administered 2014-09-18: 30 mL via SUBCUTANEOUS
  Filled 2014-09-17: qty 30

## 2014-09-17 MED ORDER — EPHEDRINE 5 MG/ML INJ
10.0000 mg | INTRAVENOUS | Status: DC | PRN
  Filled 2014-09-17: qty 2

## 2014-09-17 MED ORDER — FENTANYL 2.5 MCG/ML BUPIVACAINE 1/10 % EPIDURAL INFUSION (WH - ANES)
14.0000 mL/h | INTRAMUSCULAR | Status: DC | PRN
  Administered 2014-09-17 (×2): 14 mL/h via EPIDURAL
  Filled 2014-09-17 (×2): qty 125

## 2014-09-17 MED ORDER — ONDANSETRON HCL 4 MG/2ML IJ SOLN: 4.0000 mg | Freq: Four times a day (QID) | INTRAMUSCULAR | Status: DC | PRN

## 2014-09-17 MED ORDER — PHENYLEPHRINE 40 MCG/ML (10ML) SYRINGE FOR IV PUSH (FOR BLOOD PRESSURE SUPPORT)
80.0000 ug | PREFILLED_SYRINGE | INTRAVENOUS | Status: AC | PRN
  Administered 2014-09-17 (×3): 80 ug via INTRAVENOUS
  Filled 2014-09-17 (×4): qty 2

## 2014-09-17 MED ORDER — CITRIC ACID-SODIUM CITRATE 334-500 MG/5ML PO SOLN: 30.0000 mL | ORAL | Status: DC | PRN

## 2014-09-17 MED ORDER — OXYCODONE-ACETAMINOPHEN 5-325 MG PO TABS: 2.0000 | ORAL_TABLET | ORAL | Status: DC | PRN

## 2014-09-17 MED ORDER — ACETAMINOPHEN 325 MG PO TABS
650.0000 mg | ORAL_TABLET | ORAL | Status: DC | PRN
  Administered 2014-09-17 (×2): 650 mg via ORAL
  Filled 2014-09-17 (×2): qty 2

## 2014-09-17 MED ORDER — DIPHENHYDRAMINE HCL 50 MG/ML IJ SOLN: 12.5000 mg | INTRAMUSCULAR | Status: DC | PRN

## 2014-09-17 MED ORDER — TERBUTALINE SULFATE 1 MG/ML IJ SOLN: 0.2500 mg | Freq: Once | INTRAMUSCULAR | Status: AC | PRN

## 2014-09-17 MED ORDER — LACTATED RINGERS IV SOLN
500.0000 mL | INTRAVENOUS | Status: DC | PRN
  Administered 2014-09-17 (×4): 500 mL via INTRAVENOUS

## 2014-09-17 MED ORDER — LACTATED RINGERS IV SOLN
INTRAVENOUS | Status: DC
  Administered 2014-09-17 (×5): via INTRAVENOUS

## 2014-09-17 MED ORDER — LIDOCAINE HCL (PF) 1 % IJ SOLN
INTRAMUSCULAR | Status: DC | PRN
  Administered 2014-09-17 (×2): 8 mL

## 2014-09-17 MED ORDER — OXYTOCIN BOLUS FROM INFUSION
500.0000 mL | INTRAVENOUS | Status: DC
  Administered 2014-09-18: 500 mL via INTRAVENOUS

## 2014-09-17 MED ORDER — FENTANYL 2.5 MCG/ML BUPIVACAINE 1/10 % EPIDURAL INFUSION (WH - ANES)
INTRAMUSCULAR | Status: DC | PRN
  Administered 2014-09-17: 14 mL/h via EPIDURAL

## 2014-09-17 MED ORDER — DEXTROSE 5 % IV SOLN
2.0000 g | Freq: Four times a day (QID) | INTRAVENOUS | Status: DC
  Administered 2014-09-17 – 2014-09-18 (×2): 2 g via INTRAVENOUS
  Filled 2014-09-17 (×4): qty 2

## 2014-09-17 MED ORDER — LACTATED RINGERS IV SOLN
500.0000 mL | Freq: Once | INTRAVENOUS | Status: AC
  Administered 2014-09-17: 500 mL via INTRAVENOUS

## 2014-09-17 MED ORDER — OXYCODONE-ACETAMINOPHEN 5-325 MG PO TABS: 1.0000 | ORAL_TABLET | ORAL | Status: DC | PRN

## 2014-09-17 MED ORDER — FLEET ENEMA 7-19 GM/118ML RE ENEM: 1.0000 | ENEMA | RECTAL | Status: DC | PRN

## 2014-09-17 MED ORDER — PHENYLEPHRINE 40 MCG/ML (10ML) SYRINGE FOR IV PUSH (FOR BLOOD PRESSURE SUPPORT)
80.0000 ug | PREFILLED_SYRINGE | INTRAVENOUS | Status: DC | PRN
  Administered 2014-09-17 (×2): 80 ug via INTRAVENOUS
  Filled 2014-09-17: qty 20
  Filled 2014-09-17: qty 2
  Filled 2014-09-17: qty 20
  Filled 2014-09-17 (×2): qty 2

## 2014-09-17 NOTE — MAU Note (Signed)
[facility] Will confirm presents with u/s and then pt can go to room 168

## 2014-09-17 NOTE — Anesthesia Procedure Notes (Addendum)
Epidural Patient location during procedure: OB Start time: 09/17/2014 10:59 AM End time: 09/17/2014 11:03 AM  Staffing Anesthesiologist: Leilani AbleHATCHETT, Reco Shonk Performed by: anesthesiologist   Preanesthetic Checklist Completed: patient identified, surgical consent, pre-op evaluation, timeout performed, IV checked, risks and benefits discussed and monitors and equipment checked  Epidural Patient position: sitting Prep: site prepped and draped and DuraPrep Patient monitoring: continuous pulse ox and blood pressure Approach: midline Location: L3-L4 Injection technique: LOR air  Needle:  Needle type: Tuohy  Needle gauge: 17 G Needle length: 9 cm and 9 Needle insertion depth: 7 cm Catheter type: closed end flexible Catheter size: 19 Gauge Catheter at skin depth: 12 cm Test dose: negative and Other  Assessment Sensory level: T9 Events: blood not aspirated, injection not painful, no injection resistance, negative IV test and no paresthesia  Additional Notes Reason for block:procedure for pain

## 2014-09-17 NOTE — MAU Note (Signed)
Pt vertex by U/S, to room 168.

## 2014-09-17 NOTE — Anesthesia Preprocedure Evaluation (Signed)
Anesthesia Evaluation  Patient identified by MRN, date of birth, ID band Patient awake    Reviewed: Allergy & Precautions, H&P , NPO status , Patient's Chart, lab work & pertinent test results  Airway Mallampati: II  TM Distance: >3 FB Neck ROM: full    Dental no notable dental hx.    Pulmonary neg pulmonary ROS,  breath sounds clear to auscultation  Pulmonary exam normal       Cardiovascular negative cardio ROS      Neuro/Psych negative neurological ROS  negative psych ROS   GI/Hepatic negative GI ROS, Neg liver ROS,   Endo/Other  Morbid obesity  Renal/GU negative Renal ROS     Musculoskeletal   Abdominal (+) + obese,   Peds  Hematology negative hematology ROS (+)   Anesthesia Other Findings   Reproductive/Obstetrics (+) Pregnancy                             Anesthesia Physical Anesthesia Plan  ASA: III  Anesthesia Plan: Epidural   Post-op Pain Management:    Induction:   Airway Management Planned:   Additional Equipment:   Intra-op Plan:   Post-operative Plan:   Informed Consent: I have reviewed the patients History and Physical, chart, labs and discussed the procedure including the risks, benefits and alternatives for the proposed anesthesia with the patient or authorized representative who has indicated his/her understanding and acceptance.     Plan Discussed with:   Anesthesia Plan Comments:         Anesthesia Quick Evaluation  

## 2014-09-17 NOTE — Progress Notes (Signed)
Grace ChouStephanie Vazquez is a 29 y.o. G2P0010 at 5830w5d by LMP admitted for rupture of membranes  Subjective:   Objective: BP 113/47 mmHg  Pulse 99  Temp(Src) 102.1 F (38.9 C) (Axillary)  Resp 16  Ht 5\' 4"  (1.626 m)  Wt 225 lb (102.059 kg)  BMI 38.60 kg/m2  LMP 12/13/2013   Total I/O In: -  Out: 475 [Urine:475]  FHT:  FHR: 180 bpm, variability: moderate,  accelerations:  Present,  decelerations:  Present variable UC:   regular, every 2-4 minutes SVE:   Dilation: Lip/rim Effacement (%): 100 Station: +1, +2 Exam by:: L.Stubbs, RN  Labs: Lab Results  Component Value Date   WBC 13.3* 09/17/2014   HGB 11.4* 09/17/2014   HCT 34.0* 09/17/2014   MCV 77.1* 09/17/2014   PLT 230 09/17/2014    Assessment / Plan: Augmentation of labor, progressing well  Labor: Progressing normally Preeclampsia:  none Fetal Wellbeing:  Category I Pain Control:  Epidural I/D:  n/a Anticipated MOD:  NSVD  HARPER,CHARLES A 09/17/2014, 10:18 PM

## 2014-09-17 NOTE — Progress Notes (Signed)
Provider notified of fetal late decels and tachycardic FHR--provider reviewed strip and ok with strip at this time--orders to continue pitocin

## 2014-09-17 NOTE — H&P (Signed)
Grace ChouStephanie Vazquez is a 29 y.o. female presenting for leaking clear fluid per vagina. Maternal Medical History:  Reason for admission: Rupture of membranes.   Fetal activity: Perceived fetal activity is normal.   Last perceived fetal movement was within the past hour.    Prenatal complications: no prenatal complications Prenatal Complications - Diabetes: none.    OB History    Gravida Para Term Preterm AB TAB SAB Ectopic Multiple Living   2    1 1          Past Medical History  Diagnosis Date  . Medical history non-contributory    Past Surgical History  Procedure Laterality Date  . Dilation and curettage of uterus     Family History: family history is not on file. Social History:  reports that she has never smoked. She has never used smokeless tobacco. She reports that she does not drink alcohol or use illicit drugs.   Prenatal Transfer Tool  Maternal Diabetes: No Genetic Screening: Normal Maternal Ultrasounds/Referrals: Normal Fetal Ultrasounds or other Referrals:  None Maternal Substance Abuse:  No Significant Maternal Medications:  None Significant Maternal Lab Results:  Lab values include: Group B Strep negative Other Comments:  None  Review of Systems  All other systems reviewed and are negative.   Dilation: Fingertip Effacement (%): 50 Station: -3 Exam by:: Sharen Hintaroline Brewer RN Blood pressure 132/90, pulse 88, temperature 98 F (36.7 C), temperature source Oral, resp. rate 18, last menstrual period 12/13/2013. Maternal Exam:  Uterine Assessment: Contraction strength is mild.  Contraction frequency is irregular.   Abdomen: Patient reports no abdominal tenderness. Fetal presentation: vertex  Introitus: Normal vulva. Normal vagina.  Cervix: Cervix evaluated by digital exam.     Physical Exam  Nursing note and vitals reviewed. Constitutional: She is oriented to person, place, and time. She appears well-developed and well-nourished.  HENT:  Head: Normocephalic  and atraumatic.  Eyes: Conjunctivae are normal. Pupils are equal, round, and reactive to light.  Neck: Normal range of motion. Neck supple.  Cardiovascular: Normal rate and regular rhythm.   Respiratory: Effort normal and breath sounds normal.  GI: Soft.  Genitourinary: Vagina normal and uterus normal.  Musculoskeletal: Normal range of motion.  Neurological: She is alert and oriented to person, place, and time.  Skin: Skin is warm and dry.  Psychiatric: She has a normal mood and affect. Her behavior is normal. Judgment and thought content normal.    Prenatal labs: ABO, Rh: --/--/O POS (03/25 16100835) Antibody: NEG (03/25 0835) Rubella: 1.64 (10/28 1442) RPR: NON REAC (12/23 1023)  HBsAg: NEGATIVE (10/28 1442)  HIV: NONREACTIVE (12/23 1023)  GBS: NOT DETECTED (02/25 1121)   Assessment/Plan: 39.5 weeks.  PROM.  Admit.  Cervical ripening with po Cytotec.   Merrit Waugh A 09/17/2014, 10:10 AM

## 2014-09-17 NOTE — MAU Note (Signed)
Pt presents complaining of SROM at 0700 with a large amount of clear fluid. Reports decreased fetal movement. Denies bleeding.

## 2014-09-17 NOTE — Progress Notes (Signed)
Alvina ChouStephanie Maguire is a 29 y.o. G2P0010 at 3074w5d by LMP admitted for rupture of membranes  Subjective:   Objective: BP 129/72 mmHg  Pulse 91  Temp(Src) 102.3 F (39.1 C) (Axillary)  Resp 16  Ht 5\' 4"  (1.626 m)  Wt 225 lb (102.059 kg)  BMI 38.60 kg/m2  LMP 12/13/2013   Total I/O In: -  Out: 475 [Urine:475]  FHT:  FHR: 180 bpm, variability: minimal ,  accelerations:  Abscent,  decelerations:  Present variable UC:   regular, every 2-4 minutes SVE:   Dilation: 7.5 Effacement (%): 90 Station: 0 Exam by:: L.Stubbs, RN  Labs: Lab Results  Component Value Date   WBC 13.3* 09/17/2014   HGB 11.4* 09/17/2014   HCT 34.0* 09/17/2014   MCV 77.1* 09/17/2014   PLT 230 09/17/2014    Assessment / Plan: Augmentation of labor, progressing well  Labor: Progressing normally Preeclampsia:  none Fetal Wellbeing:  Category II Pain Control:  Epidural I/D:  n/a Anticipated MOD:  NSVD  Bradee Common A 09/17/2014, 9:29 PM

## 2014-09-18 ENCOUNTER — Encounter (HOSPITAL_COMMUNITY): Payer: Self-pay

## 2014-09-18 LAB — CBC
HCT: 30.7 % — ABNORMAL LOW (ref 36.0–46.0)
Hemoglobin: 10.1 g/dL — ABNORMAL LOW (ref 12.0–15.0)
MCH: 25.9 pg — ABNORMAL LOW (ref 26.0–34.0)
MCHC: 32.9 g/dL (ref 30.0–36.0)
MCV: 78.7 fL (ref 78.0–100.0)
PLATELETS: 168 10*3/uL (ref 150–400)
RBC: 3.9 MIL/uL (ref 3.87–5.11)
RDW: 15 % (ref 11.5–15.5)
WBC: 24.5 10*3/uL — AB (ref 4.0–10.5)

## 2014-09-18 MED ORDER — LANOLIN HYDROUS EX OINT: TOPICAL_OINTMENT | CUTANEOUS | Status: DC | PRN

## 2014-09-18 MED ORDER — DEXTROSE 5 % IV SOLN
2.0000 g | Freq: Four times a day (QID) | INTRAVENOUS | Status: AC
  Administered 2014-09-18 (×2): 2 g via INTRAVENOUS
  Filled 2014-09-18 (×2): qty 2

## 2014-09-18 MED ORDER — SIMETHICONE 80 MG PO CHEW: 80.0000 mg | CHEWABLE_TABLET | ORAL | Status: DC | PRN

## 2014-09-18 MED ORDER — ONDANSETRON HCL 4 MG PO TABS: 4.0000 mg | ORAL_TABLET | ORAL | Status: DC | PRN

## 2014-09-18 MED ORDER — OXYTOCIN 40 UNITS IN LACTATED RINGERS INFUSION - SIMPLE MED: 62.5000 mL/h | INTRAVENOUS | Status: DC | PRN

## 2014-09-18 MED ORDER — OXYCODONE-ACETAMINOPHEN 5-325 MG PO TABS: 2.0000 | ORAL_TABLET | ORAL | Status: DC | PRN

## 2014-09-18 MED ORDER — DIPHENHYDRAMINE HCL 25 MG PO CAPS: 25.0000 mg | ORAL_CAPSULE | Freq: Four times a day (QID) | ORAL | Status: DC | PRN

## 2014-09-18 MED ORDER — IBUPROFEN 600 MG PO TABS
600.0000 mg | ORAL_TABLET | Freq: Four times a day (QID) | ORAL | Status: DC
  Administered 2014-09-18 – 2014-09-20 (×9): 600 mg via ORAL
  Filled 2014-09-18 (×10): qty 1

## 2014-09-18 MED ORDER — OXYCODONE-ACETAMINOPHEN 5-325 MG PO TABS: 1.0000 | ORAL_TABLET | ORAL | Status: DC | PRN

## 2014-09-18 MED ORDER — PRENATAL MULTIVITAMIN CH
1.0000 | ORAL_TABLET | Freq: Every day | ORAL | Status: DC
  Administered 2014-09-18 – 2014-09-19 (×2): 1 via ORAL
  Filled 2014-09-18 (×3): qty 1

## 2014-09-18 MED ORDER — ACETAMINOPHEN 325 MG PO TABS: 650.0000 mg | ORAL_TABLET | ORAL | Status: DC | PRN

## 2014-09-18 MED ORDER — SENNOSIDES-DOCUSATE SODIUM 8.6-50 MG PO TABS
2.0000 | ORAL_TABLET | ORAL | Status: DC
  Administered 2014-09-19 (×2): 2 via ORAL
  Filled 2014-09-18 (×2): qty 2

## 2014-09-18 MED ORDER — ONDANSETRON HCL 4 MG/2ML IJ SOLN: 4.0000 mg | INTRAMUSCULAR | Status: DC | PRN

## 2014-09-18 MED ORDER — ZOLPIDEM TARTRATE 5 MG PO TABS: 5.0000 mg | ORAL_TABLET | Freq: Every evening | ORAL | Status: DC | PRN

## 2014-09-18 MED ORDER — WITCH HAZEL-GLYCERIN EX PADS
1.0000 "application " | MEDICATED_PAD | CUTANEOUS | Status: DC | PRN
  Administered 2014-09-18: 1 via TOPICAL

## 2014-09-18 MED ORDER — DIBUCAINE 1 % RE OINT
1.0000 "application " | TOPICAL_OINTMENT | RECTAL | Status: DC | PRN
  Administered 2014-09-18: 1 via RECTAL
  Filled 2014-09-18: qty 28

## 2014-09-18 MED ORDER — TETANUS-DIPHTH-ACELL PERTUSSIS 5-2.5-18.5 LF-MCG/0.5 IM SUSP
0.5000 mL | Freq: Once | INTRAMUSCULAR | Status: AC
  Administered 2014-09-18: 0.5 mL via INTRAMUSCULAR

## 2014-09-18 MED ORDER — BENZOCAINE-MENTHOL 20-0.5 % EX AERO
1.0000 "application " | INHALATION_SPRAY | CUTANEOUS | Status: DC | PRN
  Administered 2014-09-18: 1 via TOPICAL
  Filled 2014-09-18: qty 56

## 2014-09-18 NOTE — Lactation Note (Signed)
This note was copied from the chart of Grace Alvina ChouStephanie Laris. Lactation Consultation Note  Patient Name: Grace Vazquez JXBJY'NToday's Date: 09/18/2014 Reason for consult: Follow-up assessment;Difficult latch due to mom's flat nipples.  Mom has already been provided with a hand pump to pre-pump for eversion of her nipples but RN, Steward DroneBrenda has assessed and recommends trying a NS.  LC assessed mom's breasts and nipples and both nipples are flat but evert slightly with stimulation into NS.  #20 NS applied and mom able to apply herself.  Baby has some clicking initially with a shallow latch and after re-positioning to cross-cradle, baby is able to grasp areola and begin rhythmical sucking bursts.  Mom denies any nipple discomfort.  LC provided written handout for use and cleaning of NS and encouraged cue feedings, STS and positioning for best possible control of breast during latch and feeding until baby able to latch without NS and help.   Maternal Data Formula Feeding for Exclusion: No Has patient been taught Hand Expression?: Yes Does the patient have breastfeeding experience prior to this delivery?: No  Feeding Feeding Type: Breast Fed  LATCH Score/Interventions Latch: Repeated attempts needed to sustain latch, nipple held in mouth throughout feeding, stimulation needed to elicit sucking reflex. Intervention(s): Adjust position;Assist with latch;Breast compression (used #20 NS and cross-cradle position on (R))  Audible Swallowing: A few with stimulation Intervention(s): Hand expression Intervention(s): Hand expression;Alternate breast massage  Type of Nipple: Flat Intervention(s): Hand pump  Comfort (Breast/Nipple): Soft / non-tender     Hold (Positioning): Assistance needed to correctly position infant at breast and maintain latch. Intervention(s): Breastfeeding basics reviewed;Support Pillows;Position options;Skin to skin (recommend cross-cradle for more control during latch)  LATCH  Score: 6 (LC assisted and observed)  Lactation Tools Discussed/Used   Cue feedings, use of and cleaning of #20 NS Positioning and signs of proper latch  Consult Status Consult Status: Follow-up Date: 09/19/14 Follow-up type: In-patient    Warrick ParisianBryant, Martia Dalby [facility] 09/18/2014, 9:06 PM

## 2014-09-18 NOTE — Progress Notes (Signed)
Post Partum Day 0 Subjective: no complaints  Objective: Blood pressure 118/77, pulse 83, temperature 98.2 F (36.8 C), temperature source Axillary, resp. rate 16, height 5\' 4"  (1.626 m), weight 225 lb (102.059 kg), last menstrual period 12/13/2013, unknown if currently breastfeeding.  Physical Exam:  General: alert and no distress Lochia: appropriate Uterine Fundus: firm Incision: none DVT Evaluation: No evidence of DVT seen on physical exam.   Recent Labs  09/17/14 0835 09/18/14 0605  HGB 11.4* 10.1*  HCT 34.0* 30.7*    Assessment/Plan: Plan for discharge tomorrow   LOS: 1 day   Lynessa Almanzar A 09/18/2014, 6:35 AM

## 2014-09-18 NOTE — Lactation Note (Signed)
This note was copied from the chart of Grace Vazquez [place]. Lactation Consultation Note  Patient Name: Grace Vazquez [place] OZHYQ'MToday's Date: 09/18/2014 Reason for consult: Initial assessment Visited with Mom, baby 27 yr old.  Baby has BF X 2 and attempts X 3.  Mom describes a good latch, without discomfort. RN gave latch score of 8.  Reviewed basics with Mom.  Encouraged continued skin to skin (baby was sleeping skin to skin on Mom's chest) and feeding often on cue.  Brochure left in room.  Informed of IP and OP lactation services available.  To follow up in am and call prn.    Consult Status Consult Status: Follow-up Date: 09/19/14 Follow-up type: In-patient    Judee ClaraSmith, Kiahna Banghart E 09/18/2014, 5:15 PM

## 2014-09-18 NOTE — Anesthesia Postprocedure Evaluation (Signed)
  Anesthesia Post-op Note  Patient: Grace ChouStephanie Vazquez  Procedure(s) Performed: * No procedures listed *  Patient Location: PACU and Mother/Baby  Anesthesia Type:Epidural  Level of Consciousness: awake, alert  and oriented  Airway and Oxygen Therapy: Patient Spontanous Breathing  Post-op Pain: mild  Post-op Assessment: Post-op Vital signs reviewed, Patient's Cardiovascular Status Stable, Respiratory Function Stable, No signs of Nausea or vomiting, Adequate PO intake, Pain level controlled, No headache, No backache, No residual numbness and No residual motor weakness  Post-op Vital Signs: Reviewed and stable  Last Vitals:  Filed Vitals:   09/18/14 0831  BP: 114/46  Pulse: 59  Temp: 36.8 C  Resp: 16    Complications: No apparent anesthesia complications

## 2014-09-19 NOTE — Progress Notes (Addendum)
Post Partum Day 1 Subjective: no complaints  Objective: Blood pressure 111/65, pulse 77, temperature 98.1 F (36.7 C), temperature source Oral, resp. rate 17, height 5\' 4"  (1.626 m), weight 225 lb (102.059 kg), last menstrual period 12/13/2013, unknown if currently breastfeeding.  Physical Exam:  General: alert and no distress Lochia: appropriate Uterine Fundus: firm Incision: none DVT Evaluation: No evidence of DVT seen on physical exam.   Recent Labs  09/17/14 0835 09/18/14 0605  HGB 11.4* 10.1*  HCT 34.0* 30.7*    Assessment/Plan: Plan for discharge tomorrow   LOS: 2 days   Grace Vazquez A 09/19/2014, 8:55 AM

## 2014-09-20 MED ORDER — OXYCODONE-ACETAMINOPHEN 5-325 MG PO TABS: 2.0000 | ORAL_TABLET | ORAL | Status: DC | PRN

## 2014-09-20 MED ORDER — IBUPROFEN 600 MG PO TABS: 600.0000 mg | ORAL_TABLET | Freq: Four times a day (QID) | ORAL | Status: DC | PRN

## 2014-09-20 NOTE — Lactation Note (Signed)
This note was copied from the chart of Grace Alvina ChouStephanie [place]. Lactation Consultation Note  Patient Name: Grace Vazquez ZOXWR'UToday's Date: 09/20/2014 Reason for consult: Follow-up assessment;Difficult latch Baby is just finishing a feeding at the left breast using the #20 nipple shield. Colostrum present in the nipple shield when baby came off the breast. Mom attempted to latch baby to right breast but baby was sleepy and would not latch. Baby's last void was 12:00 yesterday, baby has had 3 voids in her life, now 658 hours old. No stool since birth. [facility] in the am[facility]otic fluid. Per MD/RN notes the fluid was clear. Mom reports baby has some difficulty with latching on right breast. Mom has not been post pumping and does not have breast pump at home. Advised Mom to call insurance and WIC about DEBP. 2 week rental and loaner program [facility] with Mom[facility]. Reviewed with Mom normal I/O for baby the 1st week. Advised to refer to page 24 Baby N Me Booklet. OP appointment scheduled for Thursday, 09/05/14 at 1:00. LC left phone number for Mom to call with next feeding for assist with latch to be sure baby is obtaining good depth and to assess need for pump at home.   Maternal Data    Feeding Feeding Type: Breast Fed Length of feed: 25 min  LATCH Score/Interventions Latch: Grasps breast easily, tongue down, lips flanged, rhythmical sucking. Intervention(s): Breast massage;Breast compression;Assist with latch;Adjust position  Audible Swallowing: Spontaneous and intermittent Intervention(s): Skin to skin;Hand expression  Type of Nipple: Flat Intervention(s): Hand pump  Comfort (Breast/Nipple): Soft / non-tender     Hold (Positioning): Assistance needed to correctly position infant at breast and maintain latch. Intervention(s): Skin to skin;Position options;Support Pillows;Breastfeeding basics reviewed  LATCH Score: 8  Lactation Tools  Discussed/Used Tools: Nipple Shields Nipple shield size: 20 Pump Review: Setup, frequency, and cleaning;Milk Storage Initiated by:: RN   Consult Status Consult Status: Follow-up Date: 09/20/14 Follow-up type: In-patient    Alfred LevinsGranger, Acire Tang Ann 09/20/2014, 11:29 AM

## 2014-09-20 NOTE — Discharge Summary (Signed)
Obstetric Discharge Summary Reason for Admission: rupture of membranes Prenatal Procedures: ultrasound Intrapartum Procedures: spontaneous vaginal delivery Postpartum Procedures: none Complications-Operative and Postpartum: none HEMOGLOBIN  Date Value Ref Range Status  09/18/2014 10.1* 12.0 - 15.0 g/dL Final   HCT  Date Value Ref Range Status  09/18/2014 30.7* 36.0 - 46.0 % Final    Physical Exam:  General: alert and no distress Lochia: appropriate Uterine Fundus: firm Incision: none DVT Evaluation: No evidence of DVT seen on physical exam.  Discharge Diagnoses: Term Pregnancy-delivered  Discharge Information: Date: 09/20/2014 Activity: pelvic rest Diet: routine Medications: PNV, Ibuprofen, Colace and Percocet Condition: stable Instructions: refer to practice specific booklet Discharge to: home Follow-up Information    Follow up with Grace Polo A, MD In 2 weeks.   Specialty:  Obstetrics and Gynecology   Contact information:   [address] [place] [address] [phone]       Newborn Data: Live born female  Birth Weight: 8 lb 4 oz (3742 g) APGAR: 6, 8  Home with mother.  Grace Vazquez 09/20/2014, 6:17 AM

## 2014-09-20 NOTE — Lactation Note (Signed)
This note was copied from the chart of Grace Vazquez. Lactation Consultation Note  Patient Name: Grace Grace Vazquez ZOXWR'UToday's Date: 09/20/2014 Reason for consult: Follow-up assessment;Difficult latch Mom called for assist as instructed. Assisted Mom with positioning and obtaining more depth with latch using #20 nipple shield. Baby has not been latching well to right breast. It takes her few minutes to develop her suckling pattern but once she did she demonstrated intermittent good suckling bursts with some swallowing noted. Small amount of colostrum visible in the nipple shield at the end of the feeding. Mom latched baby to left breast using #20 nipple shield. LC assisted with depth. Baby suckles more productively on the left breast, more colostrum visible at the end of the feeding. Baby is falling asleep about 12 minutes into the feeding on each breast. Set up DEBP for Mom to start post pumping (no d/c today). Advised Mom to post pump after each feeding for 15 minutes on standard setting. Since baby has not had any stool and at 7% weight loss, advised Mom to give baby back any amount of EBM she receives but would like baby to have a minimum of 15 ml of supplement after each feeding. Mom not opposed to using formula if needed. Baby had large void at this visit. Mom has left message with WIC to call about breast pump for home use. Plans to contact insurance company about pump coverage as well. [facility] Cleaning of pump pieces reviewed with Mom. She advise RN how she plans to give supplement, finger feeding vs bottle.  LC does not thick labial frenulum and short posterior lingual frenulum. Some chewing noted with suck exam. Demonstrated to Mom how to un-tuck the upper/lower lip. Call for assist as needed.  Maternal Data    Feeding Feeding Type: Breast Fed Length of feed: 15 min  LATCH Score/Interventions Latch: Grasps breast easily, tongue down, lips flanged, rhythmical  sucking. Intervention(s): Adjust position;Assist with latch  Audible Swallowing: A few with stimulation  Type of Nipple: Flat Intervention(s): Hand pump;Shells;Double electric pump  Comfort (Breast/Nipple): Soft / non-tender     Hold (Positioning): Assistance needed to correctly position infant at breast and maintain latch. Intervention(s): Breastfeeding basics reviewed;Support Pillows;Position options;Skin to skin  LATCH Score: 7  Lactation Tools Discussed/Used Tools: Nipple Dorris CarnesShields;Pump Nipple shield size: 20 Breast pump type: Double-Electric Breast Pump WIC Program: Yes   Consult Status Consult Status: Follow-up Date: 09/21/14 Follow-up type: In-patient    Grace Vazquez, Grace Vazquez 09/20/2014, 2:04 PM

## 2014-09-20 NOTE — Lactation Note (Signed)
This note was copied from the chart of Girl Alvina ChouStephanie Bonds. Lactat om states BF going well. Has good colostrum flow. Baby prefers Lt. Side. Mom is flat, using #20 NS. Applies well, baby latches well. Reviewed engorgement, I&O on baby. Proper latches very shallow latches.  Encouraged to schedule a LC out pt. Appt. D/t wearing NS. Mom states she will schedule one today.  Patient Name: Girl Alvina ChouStephanie Corning AVWUJ'WToday's Date: 09/20/2014 Reason for consult: Follow-up assessment   Maternal Data    Feeding Feeding Type: Breast Fed Length of feed: 15 min  LATCH Score/Interventions Latch: Grasps breast easily, tongue down, lips flanged, rhythmical sucking. Intervention(s): Breast massage;Breast compression;Assist with latch;Adjust position  Audible Swallowing: Spontaneous and intermittent Intervention(s): Skin to skin;Hand expression  Type of Nipple: Flat Intervention(s): Hand pump  Comfort (Breast/Nipple): Soft / non-tender     Hold (Positioning): Assistance needed to correctly position infant at breast and maintain latch. Intervention(s): Skin to skin;Position options;Support Pillows;Breastfeeding basics reviewed  LATCH Score: 8  Lactation Tools Discussed/Used Tools: Nipple Shields Nipple shield size: 20 Pump Review: Setup, frequency, and cleaning;Milk Storage Initiated by:: RN   Consult Status Consult Status: Complete Date: 09/20/14 Follow-up type: Call as needed    Charyl DancerCARVER, Edra Riccardi G 09/20/2014, 7:34 AM

## 2014-09-20 NOTE — Progress Notes (Signed)
Post Partum Day 2 Subjective: no complaints  Objective: Blood pressure 126/63, pulse 77, temperature 98.2 F (36.8 C), temperature source Oral, resp. rate 18, height 5\' 4"  (1.626 m), weight 225 lb (102.059 kg), last menstrual period 12/13/2013, unknown if currently breastfeeding.  Physical Exam:  General: alert and no distress Lochia: appropriate Uterine Fundus: firm Incision: none DVT Evaluation: No evidence of DVT seen on physical exam.   Recent Labs  09/17/14 0835 09/18/14 0605  HGB 11.4* 10.1*  HCT 34.0* 30.7*    Assessment/Plan: Discharge home   LOS: 3 days   HARPER,CHARLES A 09/20/2014, 6:09 AM

## 2014-09-21 ENCOUNTER — Ambulatory Visit: Payer: Self-pay

## 2014-09-21 NOTE — Lactation Note (Signed)
This note was copied from the chart of Grace Vazquez [place]. Lactation Consultation Note:Dad feeding EBM by bottle when I went into room. Mom reports she has been bottle feeding EBM and formula through the night so baby would stool. Mom reports she plans to continue bottle feeding so Dad can help with feedings once she goes back to school. Has OP appointment with us but mom states she plans to continue bottle feeding so really doesn't need appointment. States she feels comfortable using NS. To call and make appointment if wants assist with latch. Mature milk starting to come. In. Encouraged to pump q 3 hours to prevent engorgement. Has not pumped since 4:30 but will pump now. Has appointment with WIC to get pump this afternoon if she is able to make it. Asking about how much supplement to give- Dr Manson PasseyBrown in and says to watch baby's feeding cues. No questions at present. To call prn.   Patient Name: Grace Vazquez [place] ZOXWR'UToday's Date: 09/21/2014 Reason for consult: Follow-up assessment   Maternal Data Formula Feeding for Exclusion: No Has patient been taught Hand Expression?: Yes Does the patient have breastfeeding experience prior to this delivery?: No  Feeding    LATCH Score/Interventions                      Lactation Tools Discussed/Used WIC Program: Yes   Consult Status Consult Status: Complete    Pamelia HoitWeeks, Marikay Roads D 09/21/2014, 9:23 AM

## 2014-09-22 ENCOUNTER — Ambulatory Visit (HOSPITAL_COMMUNITY): Payer: BC Managed Care – PPO

## 2014-09-23 ENCOUNTER — Ambulatory Visit (HOSPITAL_COMMUNITY): Payer: BC Managed Care – PPO

## 2014-09-23 ENCOUNTER — Encounter: Payer: BC Managed Care – PPO | Admitting: Obstetrics

## 2014-09-25 ENCOUNTER — Ambulatory Visit (INDEPENDENT_AMBULATORY_CARE_PROVIDER_SITE_OTHER): Payer: BC Managed Care – PPO | Admitting: Family Medicine

## 2014-09-25 VITALS — BP 150/80 | HR 68 | Temp 100.2°F | Resp 20 | Ht 64.0 in | Wt 204.5 lb

## 2014-09-25 DIAGNOSIS — R059 Cough, unspecified: Secondary | ICD-10-CM

## 2014-09-25 DIAGNOSIS — R05 Cough: Secondary | ICD-10-CM | POA: Diagnosis not present

## 2014-09-25 DIAGNOSIS — R509 Fever, unspecified: Secondary | ICD-10-CM | POA: Diagnosis not present

## 2014-09-25 LAB — POCT CBC
Granulocyte percent: 82.9 %G — AB (ref 37–80)
HCT, POC: 34 % — AB (ref 37.7–47.9)
Hemoglobin: 10.7 g/dL — AB (ref 12.2–16.2)
Lymph, poc: 1.5 (ref 0.6–3.4)
MCH, POC: 24.7 pg — AB (ref 27–31.2)
MCHC: 31.6 g/dL — AB (ref 31.8–35.4)
MCV: 78.2 fL — AB (ref 80–97)
MID (cbc): 5 — AB (ref 0–0.9)
MPV: 7.1 fL (ref 0–99.8)
POC Granulocyte: 10.4 — AB (ref 2–6.9)
POC LYMPH PERCENT: 12.1 %L (ref 10–50)
POC MID %: 0.6 %M (ref 0–12)
Platelet Count, POC: 313 10*3/uL (ref 142–424)
RBC: 4.34 M/uL (ref 4.04–5.48)
RDW, POC: 16.6 %
WBC: 12.6 10*3/uL — AB (ref 4.6–10.2)

## 2014-09-25 LAB — POCT INFLUENZA A/B
Influenza A, POC: NEGATIVE
Influenza B, POC: NEGATIVE

## 2014-09-25 MED ORDER — AZITHROMYCIN 250 MG PO TABS: ORAL_TABLET | ORAL | Status: DC

## 2014-09-25 NOTE — Progress Notes (Signed)
Patient ID: Grace Vazquez, female   DOB: [birth year], 29 y.o.   MRN: [id number]   This chart was scribed for Elvina Sidle, MD by Haywood Pao, ED Scribe at [facility].The patient was seen in exam room 03 and the patient's care was started at 11:53 AM.  Patient ID: Grace Vazquez MRN: [id number], DOB: [birth year], 29 y.o. Date of Encounter: 09/25/2014  Primary Physician: No primary care provider on file.  Chief Complaint:  Chief Complaint  Patient presents with  . Cough  . Fever  . Chills  . Shortness of Breath   HPI:  Grace Vazquez is a 29 y.o. female who presents to [facility] complaining of a dry productive cough onset yesterday. She has a fever of 101.6, chills and SOB as associated symptoms. Pt states she did spike a fever of 102 during delivery and was given fluid until she was discharged. Pt has gotten the flu shot. 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 She has a week old daughter, pt is breastfeeding and has not had trouble. She denies sore throat.  Past Medical History  Diagnosis Date  . Medical history non-contributory     Home Meds: Prior to Admission medications   Medication Sig Start Date End Date Taking? Authorizing Provider  acetaminophen (TYLENOL) 500 MG tablet Take 1,000 mg by mouth every 6 (six) hours as needed.   Yes Historical Provider, MD  ibuprofen (ADVIL,MOTRIN) 600 MG tablet Take 1 tablet (600 mg total) by mouth every 6 (six) hours as needed. 09/20/14  Yes Brock Bad, MD  Prenatal Vit-Fe Fumarate-FA (PRENATAL MULTIVITAMIN) TABS tablet Take 1 tablet by mouth daily at 12 noon.   Yes Historical Provider, MD   Allergies: No Known Allergies  History   Social History  . Marital Status: Single    Spouse Name: N/A  . Number of Children: N/A  . Years of Education: N/A   Occupational History  . Not on file.   Social History Main Topics  . Smoking status: Never Smoker   . Smokeless tobacco: Never  Used  . Alcohol Use: No  . Drug Use: No  . Sexual Activity: Yes    Birth Control/ Protection: None   Other Topics Concern  . Not on file   Social History Narrative   Review of Systems: Constitutional: negative for night sweats, weight changes, or fatigue. Positive for fever and chills. HEENT: negative for vision changes, hearing loss, congestion, rhinorrhea, ST, epistaxis, or sinus pressure Cardiovascular: negative for chest pain or palpitations Respiratory: negative for hemoptysis, wheezing. Positive for shortness of breath, or cough Abdominal: negative for abdominal pain, nausea, vomiting, diarrhea, or constipation Dermatological: negative for rash Neurologic: negative for headache, dizziness, or syncope All other systems reviewed and are otherwise negative with the exception to those above and in the HPI.  Physical Exam: Blood pressure 150/80, pulse 68, temperature 100.2 F (37.9 C), temperature source Oral, resp. rate 20, height  (1.626 m), weight 204 lb 8 oz (92.761 kg), last menstrual period 12/13/2013, SpO2 95 %, unknown if currently breastfeeding., Body mass index is 35.09 kg/(m^2). General: Well developed, well nourished, in no acute distress. Head: Normocephalic, atraumatic, eyes without discharge, sclera non-icteric, nares are without discharge. Bilateral auditory canals clear, TM's are without perforation, pearly grey and translucent with reflective cone of light bilaterally. Oral cavity moist, posterior pharynx without exudate, erythema, peritonsillar abscess, or post nasal drip.  Neck: Supple. No thyromegaly. Full ROM. No lymphadenopathy.  Lungs: Clear bilaterally to auscultation without wheezes, rales, or rhonchi. Breathing is unlabored. Heart: RRR with S1 S2. No murmurs, rubs, or gallops appreciated. Abdomen: Soft, non-tender, non-distended with normoactive bowel sounds. No hepatomegaly. No rebound/guarding. No obvious abdominal masses. Msk:  Strength and tone normal  for age. Extremities/Skin: Warm and dry. No clubbing or cyanosis. No edema. No rashes or suspicious lesions. Neuro: Alert and oriented X 3. Moves all extremities spontaneously. Gait is normal. CNII-XII grossly in tact. Psych:  Responds to questions appropriately with a normal affect.   Labs: Results for orders placed or performed in visit on 09/25/14  POCT CBC  Result Value Ref Range   WBC 12.6 (A) 4.6 - 10.2 K/uL   Lymph, poc 1.5 0.6 - 3.4   POC LYMPH PERCENT 12.1 10 - 50 %L   MID (cbc) 5.0 (A) 0 - 0.9   POC MID % 0.6 0 - 12 %M   POC Granulocyte 10.4 (A) 2 - 6.9   Granulocyte percent 82.9 (A) 37 - 80 %G   RBC 4.34 4.04 - 5.48 M/uL   Hemoglobin 10.7 (A) 12.2 - 16.2 g/dL   HCT, POC 16.134.0 (A) 09.637.7 - 47.9 %   MCV 78.2 (A) 80 - 97 fL   MCH, POC 24.7 (A) 27 - 31.2 pg   MCHC 31.6 (A) 31.8 - 35.4 g/dL   RDW, POC 04.516.6 %   Platelet Count, POC 313 142 - 424 K/uL   MPV 7.1 0 - 99.8 fL  POCT Influenza A/B  Result Value Ref Range   Influenza A, POC Negative    Influenza B, POC Negative      ASSESSMENT AND PLAN:  29 y.o. year old female with  This chart was scribed in my presence and reviewed by me personally.    ICD-9-CM ICD-10-CM   1. Cough 786.2 R05 POCT CBC     POCT Influenza A/B     azithromycin (ZITHROMAX Z-PAK) 250 MG tablet  2. Fever, unspecified fever cause 780.60 R50.9 POCT CBC     POCT Influenza A/B     azithromycin (ZITHROMAX Z-PAK) 250 MG tablet     Signed, Elvina SidleKurt Buelah Rennie, MD

## 2014-10-05 ENCOUNTER — Ambulatory Visit (INDEPENDENT_AMBULATORY_CARE_PROVIDER_SITE_OTHER): Payer: BC Managed Care – PPO | Admitting: Obstetrics

## 2014-10-05 ENCOUNTER — Encounter: Payer: Self-pay | Admitting: Obstetrics

## 2014-10-05 DIAGNOSIS — Z30011 Encounter for initial prescription of contraceptive pills: Secondary | ICD-10-CM

## 2014-10-05 MED ORDER — NORETHINDRONE 0.35 MG PO TABS: 1.0000 | ORAL_TABLET | Freq: Every day | ORAL | Status: DC

## 2014-10-05 NOTE — Progress Notes (Signed)
Subjective:     Grace Vazquez is a 29 y.o. female who presents for a postpartum visit. She is 2 weeks postpartum following a spontaneous vaginal delivery. I have fully reviewed the prenatal and intrapartum course. The delivery was at 39.6 gestational weeks. Outcome: spontaneous vaginal delivery. Anesthesia: epidural. Postpartum course has been normal except for mild constipation. Baby's course has been normal. Baby is feeding by breast. Bleeding thin lochia. Bowel function is abnormal: mild constipation and hemorrhoids. Bladder function is normal. Patient is not sexually active. Contraception method is abstinence. Postpartum depression screening: negative.  Tobacco, alcohol and substance abuse history reviewed.  Adult immunizations reviewed including TDAP, rubella and varicella.  The following portions of the patient's history were reviewed and updated as appropriate: allergies, current medications, past family history, past medical history, past social history, past surgical history and problem list.  Review of Systems Gastrointestinal: positive for constipation   Objective:    BP 102/74 mmHg  Pulse 87  Temp(Src) 98.3 F (36.8 C)  Ht 5\' 4"  (1.626 m)  Wt 188 lb (85.276 kg)  BMI 32.25 kg/m2  LMP  (LMP Unknown)  Breastfeeding? Yes   PE:  Deferred   100% of 10 min visit spent on counseling and coordination of care.  Assessment:    2 weeks postpartum.  Mild constipation.  Instructions given.  Contraceptive management.  Plan:    1. Contraception: oral progesterone-only contraceptive 2.  Increase fluids and fiber.  Continue Colace and Miralax prn. 3. Follow up in: 4 weeks or as needed.   Healthy lifestyle practices reviewed

## 2014-11-02 ENCOUNTER — Encounter: Payer: Self-pay | Admitting: Obstetrics

## 2014-11-02 ENCOUNTER — Ambulatory Visit (INDEPENDENT_AMBULATORY_CARE_PROVIDER_SITE_OTHER): Payer: BC Managed Care – PPO | Admitting: Obstetrics

## 2014-11-02 DIAGNOSIS — Z30014 Encounter for initial prescription of intrauterine contraceptive device: Secondary | ICD-10-CM

## 2014-11-02 MED ORDER — HYDROCORTISONE ACETATE 25 MG RE SUPP: 25.0000 mg | Freq: Two times a day (BID) | RECTAL | Status: DC

## 2014-11-02 NOTE — Progress Notes (Signed)
Subjective:     Grace Vazquez is a [age] y.o. female who presents for a postpartum visit. She is 6 weeks postpartum following a spontaneous vaginal delivery. I have fully reviewed the prenatal and intrapartum course. The delivery was at 39.6 gestational weeks. Outcome: spontaneous vaginal delivery. Anesthesia: epidural. Postpartum course has been normal. Baby's course has been normal. Baby is feeding by bottle - Similac Advance. Bleeding no bleeding. Bowel function is normal. Bladder function is normal. Patient is not sexually active. Contraception method is abstinence. Postpartum depression screening: negative.  Tobacco, alcohol and substance abuse history reviewed.  Adult immunizations reviewed including TDAP, rubella and varicella.  The following portions of the patient's history were reviewed and updated as appropriate: allergies, current medications, past family history, past medical history, past social history, past surgical history and problem list.  Review of Systems A comprehensive review of systems was negative.   Objective:    BP 124/75 mmHg  Pulse 87  Temp(Src) 97.7 F (36.5 C)  Ht 5\' 4"  (1.626 m)  Wt 187 lb (84.823 kg)  BMI 32.08 kg/m2  LMP  (LMP Unknown)  Breastfeeding? No  General:  alert and no distress   Breasts:  inspection negative, no nipple discharge or bleeding, no masses or nodularity palpable  Lungs: clear to auscultation bilaterally  Heart:  regular rate and rhythm, S1, S2 normal, no murmur, click, rub or gallop  Abdomen: normal findings: soft, non-tender   Vulva:  normal  Vagina: normal vagina  Cervix:  no cervical motion tenderness  Corpus: normal size, contour, position, consistency, mobility, non-tender  Adnexa:  no mass, fullness, tenderness  Rectal Exam:  hemorrhoids, small,            Assessment:     Normal postpartum exam. Pap smear not done at today's visit.    Hemorrhoids, benign   Plan:    1. Contraception: IUD 2. ParaGuard IUD Rx 3.  Follow up in: 1 month or as needed.   4. Anusol HC Rx  Healthy lifestyle practices reviewed

## 2014-11-11 ENCOUNTER — Telehealth: Payer: Self-pay | Admitting: *Deleted

## 2014-11-11 NOTE — Telephone Encounter (Signed)
Patient started her menstrual cycle. Patient has not had intercourse. Patient is interested in a ParaGard IUD for contraception. Patient has been scheduled for 11-12-14 @ 10:30.

## 2014-11-12 ENCOUNTER — Ambulatory Visit (INDEPENDENT_AMBULATORY_CARE_PROVIDER_SITE_OTHER): Payer: BC Managed Care – PPO | Admitting: Obstetrics

## 2014-11-12 ENCOUNTER — Encounter: Payer: Self-pay | Admitting: Obstetrics

## 2014-11-12 VITALS — BP 116/80 | HR 92 | Temp 97.0°F | Ht 64.0 in | Wt 188.0 lb

## 2014-11-12 DIAGNOSIS — Z01812 Encounter for preprocedural laboratory examination: Secondary | ICD-10-CM

## 2014-11-12 DIAGNOSIS — Z30430 Encounter for insertion of intrauterine contraceptive device: Secondary | ICD-10-CM | POA: Diagnosis not present

## 2014-11-12 LAB — POCT URINE PREGNANCY: Preg Test, Ur: NEGATIVE

## 2014-11-12 NOTE — Progress Notes (Signed)
IUD Insertion Procedure Note  Pre-operative Diagnosis: Desire long term reversible contraception ( LTRC )  Post-operative Diagnosis: same  Indications: contraception  Procedure Details  Urine pregnancy test was done in office and result was negative.  The risks (including infection, bleeding, pain, and uterine perforation) and benefits of the procedure were explained to the patient and Written informed consent was obtained.    Cervix cleansed with Betadine. Uterus sounded to 8 cm. IUD inserted without difficulty. String visible and trimmed. Patient tolerated procedure well.  IUD Information: ParaGard, Lot # [id number], Expiration date JAN 2022.  Condition: Stable  Complications: None  Plan:  The patient was advised to call for any fever or for prolonged or severe pain or bleeding. She was advised to use NSAID as needed for mild to moderate pain.   Attending Physician Documentation: I was present for or participated in the entire procedure, including opening and closing.

## 2014-11-17 ENCOUNTER — Other Ambulatory Visit: Payer: Self-pay | Admitting: Obstetrics

## 2014-11-17 DIAGNOSIS — N760 Acute vaginitis: Principal | ICD-10-CM

## 2014-11-17 DIAGNOSIS — B9689 Other specified bacterial agents as the cause of diseases classified elsewhere: Secondary | ICD-10-CM

## 2014-11-17 LAB — SURESWAB, VAGINOSIS/VAGINITIS PLUS
Atopobium vaginae: 4.9 Log (cells/mL)
C. TROPICALIS, DNA: NOT DETECTED
C. albicans, DNA: NOT DETECTED
C. glabrata, DNA: NOT DETECTED
C. parapsilosis, DNA: NOT DETECTED
C. trachomatis RNA, TMA: NOT DETECTED
Gardnerella vaginalis: 6.1 Log (cells/mL)
LACTOBACILLUS SPECIES: NOT DETECTED Log (cells/mL)
MEGASPHAERA SPECIES: NOT DETECTED Log (cells/mL)
N. GONORRHOEAE RNA, TMA: NOT DETECTED
T. vaginalis RNA, QL TMA: NOT DETECTED

## 2014-11-17 MED ORDER — TINIDAZOLE 500 MG PO TABS: 1000.0000 mg | ORAL_TABLET | Freq: Every day | ORAL | Status: DC

## 2014-12-23 ENCOUNTER — Ambulatory Visit (INDEPENDENT_AMBULATORY_CARE_PROVIDER_SITE_OTHER): Payer: BC Managed Care – PPO | Admitting: Obstetrics

## 2014-12-23 ENCOUNTER — Encounter: Payer: Self-pay | Admitting: Obstetrics

## 2014-12-23 VITALS — BP 127/79 | HR 70 | Temp 97.4°F | Ht 64.0 in | Wt 190.0 lb

## 2014-12-23 DIAGNOSIS — Z30431 Encounter for routine checking of intrauterine contraceptive device: Secondary | ICD-10-CM

## 2014-12-23 NOTE — Progress Notes (Signed)
Subjective:    Alvina ChouStephanie Davis is a 29 y.o. female who presents for contraception counseling. The patient has no complaints today. The patient is sexually active. Pertinent past medical history: none.  The information documented in the HPI was reviewed and verified.  Menstrual History: OB History    Gravida Para Term Preterm AB TAB SAB Ectopic Multiple Living   2 1 1  1 1    0 1      Patient's last menstrual period was 12/06/2014.   Patient Active Problem List   Diagnosis Date Noted  . NSVD (normal spontaneous vaginal delivery) 09/18/2014   Past Medical History  Diagnosis Date  . Medical history non-contributory     Past Surgical History  Procedure Laterality Date  . Dilation and curettage of uterus       Current outpatient prescriptions:  .  Prenatal Vit-Fe Fumarate-FA (PRENATAL MULTIVITAMIN) TABS tablet, Take 1 tablet by mouth daily at 12 noon., Disp: , Rfl:  .  hydrocortisone (ANUSOL-HC) 25 MG suppository, Place 1 suppository (25 mg total) rectally 2 (two) times daily. (Patient not taking: Reported on 12/23/2014), Disp: 24 suppository, Rfl: prn .  tinidazole (TINDAMAX) 500 MG tablet, Take 2 tablets (1,000 mg total) by mouth daily with breakfast. (Patient not taking: Reported on 12/23/2014), Disp: 10 tablet, Rfl: 2 No Known Allergies  History  Substance Use Topics  . Smoking status: Never Smoker   . Smokeless tobacco: Never Used  . Alcohol Use: No    History reviewed. No pertinent family history.     Review of Systems Constitutional: negative for weight loss Genitourinary:negative for abnormal menstrual periods and vaginal discharge   Objective:   BP 127/79 mmHg  Pulse 70  Temp(Src) 97.4 F (36.3 C)  Ht 5\' 4"  (1.626 m)  Wt 190 lb (86.183 kg)  BMI 32.60 kg/m2  LMP 12/06/2014   PE:        General:  Alert and no distress      Abdomen:  Soft, nontender      Pelvic:  Speculum exam reveals string from cervix, normal length      Bimanual:  Uterus NSSC,  nontender   Lab Review Urine pregnancy test Labs reviewed yes Radiologic studies reviewed no    Assessment:    29 y.o., continuing IUD, no contraindications.   Plan:    All questions answered. Contraception: IUD. Discussed healthy lifestyle modifications. F/U for pap smear and annual.  No orders of the defined types were placed in this encounter.   No orders of the defined types were placed in this encounter.

## 2015-03-01 ENCOUNTER — Telehealth: Payer: Self-pay | Admitting: *Deleted

## 2015-03-01 DIAGNOSIS — B9689 Other specified bacterial agents as the cause of diseases classified elsewhere: Secondary | ICD-10-CM

## 2015-03-01 DIAGNOSIS — N760 Acute vaginitis: Principal | ICD-10-CM

## 2015-03-01 MED ORDER — METRONIDAZOLE 500 MG PO TABS: 500.0000 mg | ORAL_TABLET | Freq: Two times a day (BID) | ORAL | Status: DC

## 2015-03-01 NOTE — Telephone Encounter (Signed)
Patient has some Paragard questions and also thinks she has BV and wants treatment. 3:45 Spoke with patient- she had her IUD placed in June and has had fairly normal cycles- they have been heavier at the beginning. She had a cycle and then had 2 weeks of spotting. Patient advised to see if that happens again and we will check things out for her. She also thinks she has BV and would like treatment. Oral ATB called to her pharmacy.

## 2015-04-26 ENCOUNTER — Ambulatory Visit (INDEPENDENT_AMBULATORY_CARE_PROVIDER_SITE_OTHER): Payer: BC Managed Care – PPO | Admitting: Obstetrics

## 2015-04-26 ENCOUNTER — Encounter: Payer: Self-pay | Admitting: Obstetrics

## 2015-04-26 VITALS — BP 130/77 | HR 82 | Temp 97.9°F | Ht 64.0 in | Wt 194.0 lb

## 2015-04-26 DIAGNOSIS — Z01419 Encounter for gynecological examination (general) (routine) without abnormal findings: Secondary | ICD-10-CM

## 2015-04-26 DIAGNOSIS — Z30431 Encounter for routine checking of intrauterine contraceptive device: Secondary | ICD-10-CM

## 2015-04-26 DIAGNOSIS — B9689 Other specified bacterial agents as the cause of diseases classified elsewhere: Secondary | ICD-10-CM

## 2015-04-26 DIAGNOSIS — N760 Acute vaginitis: Secondary | ICD-10-CM

## 2015-04-26 DIAGNOSIS — B373 Candidiasis of vulva and vagina: Secondary | ICD-10-CM

## 2015-04-26 DIAGNOSIS — B3731 Acute candidiasis of vulva and vagina: Secondary | ICD-10-CM

## 2015-04-26 MED ORDER — FLUCONAZOLE 150 MG PO TABS: 150.0000 mg | ORAL_TABLET | Freq: Once | ORAL | Status: DC

## 2015-04-26 MED ORDER — PRENATAL PLUS 27-1 MG PO TABS: 1.0000 | ORAL_TABLET | Freq: Every day | ORAL | Status: DC

## 2015-04-26 MED ORDER — CLINDAMYCIN HCL 300 MG PO CAPS: 300.0000 mg | ORAL_CAPSULE | Freq: Three times a day (TID) | ORAL | Status: DC

## 2015-04-26 NOTE — Progress Notes (Signed)
Subjective:        Grace Vazquez is a 29 y.o. female here for a routine exam.  Current complaints: Has ParaGuard IUD and has had spotting after period.    Personal health questionnaire:  Is patient Ashkenazi Jewish, have a family history of breast and/or ovarian cancer: no Is there a family history of uterine cancer diagnosed at age < 56, gastrointestinal cancer, urinary tract cancer, family member who is a Personnel officer syndrome-associated carrier: no Is the patient overweight and hypertensive, family history of diabetes, personal history of gestational diabetes, preeclampsia or PCOS: no Is patient over 65, have PCOS,  family history of premature CHD under age 22, diabetes, smoke, have hypertension or peripheral artery disease:  no At any time, has a partner hit, kicked or otherwise hurt or frightened you?: no Over the past 2 weeks, have you felt down, depressed or hopeless?: no Over the past 2 weeks, have you felt little interest or pleasure in doing things?:no   Gynecologic History Patient's last menstrual period was 04/15/2015. Contraception: IUD Last Pap: unknown. Results were: normal Last mammogram: n/a. Results were: n/a  Obstetric History OB History  Gravida Para Term Preterm AB SAB TAB Ectopic Multiple Living  0 1    # Outcome Date GA Lbr Len/2nd Weight Sex Delivery Anes PTL Lv  2 Term 09/18/14 [redacted]w[redacted]d 16:58 / 00:52 8 lb 4 oz (3.742 kg) F Vag-Spont EPI  Y  1 TAB 2011              Past Medical History  Diagnosis Date  . Medical history non-contributory     Past Surgical History  Procedure Laterality Date  . Dilation and curettage of uterus       Current outpatient prescriptions:  .  clindamycin (CLEOCIN) 300 MG capsule, Take 1 capsule (300 mg total) by mouth 3 (three) times daily., Disp: 21 capsule, Rfl: 2 .  fluconazole (DIFLUCAN) 150 MG tablet, Take 1 tablet (150 mg total) by mouth once., Disp: 1 tablet, Rfl: 2 .  prenatal vitamin w/FE, FA (PRENATAL 1  + 1) 27-1 MG TABS tablet, Take 1 tablet by mouth daily before breakfast., Disp: 30 each, Rfl: 11 No Known Allergies  Social History  Substance Use Topics  . Smoking status: Never Smoker   . Smokeless tobacco: Never Used  . Alcohol Use: No    History reviewed. No pertinent family history.    Review of Systems  Constitutional: negative for fatigue and weight loss Respiratory: negative for cough and wheezing Cardiovascular: negative for chest pain, fatigue and palpitations Gastrointestinal: negative for abdominal pain and change in bowel habits Musculoskeletal:negative for myalgias Neurological: negative for gait problems and tremors Behavioral/Psych: negative for abusive relationship, depression Endocrine: negative for temperature intolerance   Genitourinary: positive for abnormal menstrual periods and vaginal discharge Integument/breast: negative for breast lump, breast tenderness, nipple discharge and skin lesion(s)    Objective:       BP 130/77 mmHg  Pulse 82  Temp(Src) 97.9 F (36.6 C)  Ht  (1.626 m)  Wt 194 lb (87.998 kg)  BMI 33.28 kg/m2  LMP 04/15/2015  Breastfeeding? No General:   alert  Skin:   no rash or abnormalities  Lungs:   clear to auscultation bilaterally  Heart:   regular rate and rhythm, S1, S2 normal, no murmur, click, rub or gallop  Breasts:   normal without suspicious masses, skin or nipple changes or axillary nodes  Abdomen:  normal findings: no organomegaly, soft, non-tender and no hernia  Pelvis:  External genitalia: normal general appearance Urinary system: urethral meatus normal and bladder without fullness, nontender Vaginal: normal without tenderness, induration or masses.  Thin, gray malodorous discharge Cervix: normal appearance Adnexa: normal bimanual exam Uterus: anteverted and non-tender, normal size   Lab Review Urine pregnancy test Labs reviewed yes Radiologic studies reviewed no    Assessment:    Healthy female exam.     Contraceptive Surveillance.  Pleased with IUD.  BV   Plan:   Clindamycin Rx   Education reviewed: low fat, low cholesterol diet, safe sex/STD prevention and self breast exams. Contraception: IUD. Follow up in: 1 year.   Meds ordered this encounter  Medications  . clindamycin (CLEOCIN) 300 MG capsule    Sig: Take 1 capsule (300 mg total) by mouth 3 (three) times daily.    Dispense:  21 capsule    Refill:  2  . prenatal vitamin w/FE, FA (PRENATAL 1 + 1) 27-1 MG TABS tablet    Sig: Take 1 tablet by mouth daily before breakfast.    Dispense:  30 each    Refill:  11  . fluconazole (DIFLUCAN) 150 MG tablet    Sig: Take 1 tablet (150 mg total) by mouth once.    Dispense:  1 tablet    Refill:  2   No orders of the defined types were placed in this encounter.

## 2015-04-26 NOTE — Addendum Note (Signed)
Addended by: Marya LandryFOSTER, Jamacia Jester D on: 04/26/2015 03:05 PM   Modules accepted: Orders

## 2015-04-27 LAB — PAP IG W/ RFLX HPV ASCU

## 2015-05-02 ENCOUNTER — Other Ambulatory Visit: Payer: Self-pay | Admitting: Obstetrics

## 2015-05-02 LAB — SURESWAB, VAGINOSIS/VAGINITIS PLUS
ATOPOBIUM VAGINAE: 7.3 Log (cells/mL)
BV CATEGORY: UNDETERMINED — AB
C. ALBICANS, DNA: DETECTED — AB
C. GLABRATA, DNA: NOT DETECTED
C. PARAPSILOSIS, DNA: NOT DETECTED
C. TROPICALIS, DNA: NOT DETECTED
C. trachomatis RNA, TMA: NOT DETECTED
Gardnerella vaginalis: >8 Log (cells/mL)
LACTOBACILLUS SPECIES: DETECTED Log (cells/mL)
MEGASPHAERA SPECIES: >8 Log (cells/mL)
N. gonorrhoeae RNA, TMA: NOT DETECTED
T. vaginalis RNA, QL TMA: NOT DETECTED

## 2015-09-20 ENCOUNTER — Telehealth: Payer: Self-pay | Admitting: *Deleted

## 2015-09-20 DIAGNOSIS — N760 Acute vaginitis: Principal | ICD-10-CM

## 2015-09-20 DIAGNOSIS — B9689 Other specified bacterial agents as the cause of diseases classified elsewhere: Secondary | ICD-10-CM

## 2015-09-20 MED ORDER — METRONIDAZOLE 500 MG PO TABS: 500.0000 mg | ORAL_TABLET | Freq: Two times a day (BID) | ORAL | Status: DC

## 2015-09-20 NOTE — Telephone Encounter (Signed)
Patient is calling with BV symptoms. She has had it in the past and believes that is what she has. Told patient we could treat this time and to call if she did not improve.

## 2015-12-13 ENCOUNTER — Other Ambulatory Visit: Payer: Self-pay | Admitting: *Deleted

## 2015-12-13 DIAGNOSIS — B9689 Other specified bacterial agents as the cause of diseases classified elsewhere: Secondary | ICD-10-CM

## 2015-12-13 DIAGNOSIS — N760 Acute vaginitis: Principal | ICD-10-CM

## 2015-12-13 MED ORDER — METRONIDAZOLE 500 MG PO TABS: 500.0000 mg | ORAL_TABLET | Freq: Two times a day (BID) | ORAL | Status: DC

## 2015-12-13 NOTE — Progress Notes (Signed)
Pt called to office with complaints of BV symptoms.  Return call to pt. Pt states that she is having more d/c than normal and there is an odor.  Pt states that symptoms are the same as BV as she has had in the past.  Pt made aware treatment could be sent per protocol.  Flagyl 500mg  sent to pharmacy.

## 2016-04-26 ENCOUNTER — Encounter: Payer: Self-pay | Admitting: *Deleted

## 2016-04-26 ENCOUNTER — Encounter: Payer: Self-pay | Admitting: Obstetrics

## 2016-04-26 ENCOUNTER — Ambulatory Visit (INDEPENDENT_AMBULATORY_CARE_PROVIDER_SITE_OTHER): Payer: BC Managed Care – PPO | Admitting: Obstetrics

## 2016-04-26 VITALS — BP 111/71 | HR 78 | Temp 98.0°F | Ht 64.0 in | Wt 196.4 lb

## 2016-04-26 DIAGNOSIS — Z30431 Encounter for routine checking of intrauterine contraceptive device: Secondary | ICD-10-CM

## 2016-04-26 DIAGNOSIS — Z124 Encounter for screening for malignant neoplasm of cervix: Secondary | ICD-10-CM

## 2016-04-26 DIAGNOSIS — N898 Other specified noninflammatory disorders of vagina: Secondary | ICD-10-CM | POA: Diagnosis not present

## 2016-04-26 DIAGNOSIS — Z01419 Encounter for gynecological examination (general) (routine) without abnormal findings: Secondary | ICD-10-CM | POA: Diagnosis not present

## 2016-04-26 MED ORDER — PRENATAL PLUS 27-1 MG PO TABS: 1.0000 | ORAL_TABLET | Freq: Every day | ORAL | 11 refills | Status: DC

## 2016-04-26 NOTE — Progress Notes (Signed)
Subjective:        Grace Vazquez is a 30 y.o. female here for a routine exam.  Current complaints: None.    Personal health questionnaire:  Is patient Ashkenazi Jewish, have a family history of breast and/or ovarian cancer: no Is there a family history of uterine cancer diagnosed at age < 7350, gastrointestinal cancer, urinary tract cancer, family member who is a Personnel officerLynch syndrome-associated carrier: no Is the patient overweight and hypertensive, family history of diabetes, personal history of gestational diabetes, preeclampsia or PCOS: no Is patient over 4955, have PCOS,  family history of premature CHD under age 28, diabetes, smoke, have hypertension or peripheral artery disease:  no At any time, has a partner hit, kicked or otherwise hurt or frightened you?: no Over the past 2 weeks, have you felt down, depressed or hopeless?: no Over the past 2 weeks, have you felt little interest or pleasure in doing things?:no   Gynecologic History No LMP recorded. Contraception: IUD Last Pap: 2016. Results were: normal Last mammogram: n/a. Results were: n/a  Obstetric History OB History  Gravida Para Term Preterm AB Living  2 1 1   1 1   SAB TAB Ectopic Multiple Live Births    1   0 1    # Outcome Date GA Lbr Len/2nd Weight Sex Delivery Anes PTL Lv  2 Term 09/18/14 [id number] 16:58 / 00:52 8 lb 4 oz (3.742 kg) F Vag-Spont EPI  LIV  1 TAB 2011              Past Medical History:  Diagnosis Date  . Medical history non-contributory     Past Surgical History:  Procedure Laterality Date  . DILATION AND CURETTAGE OF UTERUS       Current Outpatient Prescriptions:  .  clindamycin (CLEOCIN) 300 MG capsule, Take 1 capsule (300 mg total) by mouth 3 (three) times daily. (Patient not taking: Reported on 04/26/2016), Disp: 21 capsule, Rfl: 2 .  fluconazole (DIFLUCAN) 150 MG tablet, Take 1 tablet (150 mg total) by mouth once. (Patient not taking: Reported on 04/26/2016), Disp: 1 tablet, Rfl: 2 .   metroNIDAZOLE (FLAGYL) 500 MG tablet, Take 1 tablet (500 mg total) by mouth 2 (two) times daily. (Patient not taking: Reported on 04/26/2016), Disp: 14 tablet, Rfl: 0 .  prenatal vitamin w/FE, FA (PRENATAL 1 + 1) 27-1 MG TABS tablet, Take 1 tablet by mouth daily before breakfast., Disp: 30 each, Rfl: 11 No Known Allergies  Social History  Substance Use Topics  . Smoking status: Never Smoker  . Smokeless tobacco: Never Used  . Alcohol use No    History reviewed. No pertinent family history.    Review of Systems  Constitutional: negative for fatigue and weight loss Respiratory: negative for cough and wheezing Cardiovascular: negative for chest pain, fatigue and palpitations Gastrointestinal: negative for abdominal pain and change in bowel habits Musculoskeletal:negative for myalgias Neurological: negative for gait problems and tremors Behavioral/Psych: negative for abusive relationship, depression Endocrine: negative for temperature intolerance   Genitourinary:negative for abnormal menstrual periods, genital lesions, hot flashes, sexual problems and vaginal discharge Integument/breast: negative for breast lump, breast tenderness, nipple discharge and skin lesion(s)    Objective:       BP 111/71   Pulse 78   Temp 98 F (36.7 C) (Oral)   Ht 5\' 4"  (1.626 m)   Wt 196 lb 6.4 oz (89.1 kg)   BMI 33.71 kg/m  General:   alert  Skin:   no rash  or abnormalities  Lungs:   clear to auscultation bilaterally  Heart:   regular rate and rhythm, S1, S2 normal, no murmur, click, rub or gallop  Breasts:   normal without suspicious masses, skin or nipple changes or axillary nodes  Abdomen:  normal findings: no organomegaly, soft, non-tender and no hernia  Pelvis:  External genitalia: normal general appearance Urinary system: urethral meatus normal and bladder without fullness, nontender Vaginal: normal without tenderness, induration or masses Cervix: normal appearance.  IUD string  visible. Adnexa: normal bimanual exam Uterus: anteverted and non-tender, normal size   Lab Review Urine pregnancy test Labs reviewed yes Radiologic studies reviewed no  50% of 20 min visit spent on counseling and coordination of care.   Assessment:    Healthy female exam.    Excessive vaginal discharge  IUD Surveillance.  Pleased with ParaGuard IUD.   Plan:    Wet prep and cultures sent  Education reviewed: calcium supplements, low fat, low cholesterol diet, safe sex/STD prevention, self breast exams and weight bearing exercise. Contraception: IUD. Follow up in: 1 year.   Meds ordered this encounter  Medications  . prenatal vitamin w/FE, FA (PRENATAL 1 + 1) 27-1 MG TABS tablet    Sig: Take 1 tablet by mouth daily before breakfast.    Dispense:  30 each    Refill:  11   No orders of the defined types were placed in this encounter.    Patient ID: Grace ChouStephanie Frampton, female   DOB: [birth year], 30 y.o.   MRN: [id number]

## 2016-04-30 ENCOUNTER — Other Ambulatory Visit: Payer: Self-pay | Admitting: Obstetrics

## 2016-04-30 LAB — NUSWAB VG+, CANDIDA 6SP
ATOPOBIUM VAGINAE: HIGH {score} — AB
BVAB 2: HIGH {score} — AB
CANDIDA ALBICANS, NAA: POSITIVE — AB
CANDIDA GLABRATA, NAA: NEGATIVE
CANDIDA KRUSEI, NAA: NEGATIVE
CANDIDA TROPICALIS, NAA: NEGATIVE
Candida lusitaniae, NAA: NEGATIVE
Candida parapsilosis, NAA: NEGATIVE
Chlamydia trachomatis, NAA: NEGATIVE
Megasphaera 1: HIGH Score — AB
NEISSERIA GONORRHOEAE, NAA: NEGATIVE
Trich vag by NAA: NEGATIVE

## 2016-04-30 LAB — CYTOLOGY - PAP: Diagnosis: NEGATIVE

## 2016-05-08 ENCOUNTER — Other Ambulatory Visit: Payer: Self-pay | Admitting: *Deleted

## 2016-05-08 ENCOUNTER — Telehealth: Payer: Self-pay | Admitting: *Deleted

## 2016-05-08 DIAGNOSIS — B3731 Acute candidiasis of vulva and vagina: Secondary | ICD-10-CM

## 2016-05-08 DIAGNOSIS — B9689 Other specified bacterial agents as the cause of diseases classified elsewhere: Secondary | ICD-10-CM

## 2016-05-08 DIAGNOSIS — B373 Candidiasis of vulva and vagina: Secondary | ICD-10-CM

## 2016-05-08 DIAGNOSIS — N760 Acute vaginitis: Principal | ICD-10-CM

## 2016-05-08 MED ORDER — FLUCONAZOLE 150 MG PO TABS
150.0000 mg | ORAL_TABLET | Freq: Once | ORAL | 2 refills | Status: AC
Start: 2016-05-08 — End: 2016-05-08

## 2016-05-08 MED ORDER — CLINDAMYCIN HCL 300 MG PO CAPS: 300.0000 mg | ORAL_CAPSULE | Freq: Three times a day (TID) | ORAL | 2 refills | Status: DC

## 2016-05-08 NOTE — Progress Notes (Signed)
Pt called to office for lab results.  Labs show +BV and yeast. Cleocin 300mg  and Diflucan 150mg  sent to pharmacy as pt has taken this treatment in the past.

## 2016-05-08 NOTE — Telephone Encounter (Signed)
Pt called to office, leaving no message.  Attempt to return call. No answer, LM on VM to call back.

## 2017-01-17 ENCOUNTER — Ambulatory Visit (INDEPENDENT_AMBULATORY_CARE_PROVIDER_SITE_OTHER): Payer: BC Managed Care – PPO | Admitting: Obstetrics

## 2017-01-17 ENCOUNTER — Encounter: Payer: Self-pay | Admitting: Obstetrics

## 2017-01-17 VITALS — BP 115/78 | HR 84 | Wt 178.4 lb

## 2017-01-17 DIAGNOSIS — Z113 Encounter for screening for infections with a predominantly sexual mode of transmission: Secondary | ICD-10-CM

## 2017-01-17 DIAGNOSIS — N898 Other specified noninflammatory disorders of vagina: Secondary | ICD-10-CM

## 2017-01-17 NOTE — Progress Notes (Signed)
Patient has discharge that she wants to get checked.

## 2017-01-17 NOTE — Progress Notes (Signed)
Patient ID: Grace Vazquez, female   DOB: 11-19-1985, 31 y.o.   MRN: [id number]  Chief Complaint  Patient presents with  . Gynecologic Exam    HPI Grace Vazquez is a 31 y.o. female.  Complains of vaginal discharge.  Possible exposure to STD.  Requests to be checked for STD.  Denies vaginal itching or odor. HPI  Past Medical History:  Diagnosis Date  . Medical history non-contributory     Past Surgical History:  Procedure Laterality Date  . DILATION AND CURETTAGE OF UTERUS      History reviewed. No pertinent family history.  Social History Social History  Substance Use Topics  . Smoking status: Never Smoker  . Smokeless tobacco: Never Used  . Alcohol use No    No Known Allergies  No current outpatient prescriptions on file.   No current facility-administered medications for this visit.     Review of Systems Review of Systems Constitutional: negative for fatigue and weight loss Respiratory: negative for cough and wheezing Cardiovascular: negative for chest pain, fatigue and palpitations Gastrointestinal: negative for abdominal pain and change in bowel habits Genitourinary:positive for vaginal discharge Integument/breast: negative for nipple discharge Musculoskeletal:negative for myalgias Neurological: negative for gait problems and tremors Behavioral/Psych: negative for abusive relationship, depression Endocrine: negative for temperature intolerance      Blood pressure 115/78, pulse 84, weight 178 lb 6.4 oz (80.9 kg), last menstrual period 01/08/2017.  Physical Exam Physical Exam           General: Alert and no distress Abdomen:  normal findings: no organomegaly, soft, non-tender and no hernia  Pelvis:  External genitalia: normal general appearance Urinary system: urethral meatus normal and bladder without fullness, nontender Vaginal: normal without tenderness, induration or masses Cervix: normal appearance Adnexa: normal bimanual exam Uterus: anteverted  and non-tender, normal size    50% of 15 min visit spent on counseling and coordination of care.    Data Reviewed Wet prep  Assessment     1. Vaginal discharge - Wet Prep and Cultures done   2. Screen for STD (sexually transmitted disease) Rx: - Cervicovaginal ancillary only - HIV antibody - Hepatitis B surface antigen - RPR - Hepatitis C antibody    Plan    Follow up in 4 months for Annual  Orders Placed This Encounter  Procedures  . HIV antibody  . Hepatitis B surface antigen  . RPR  . Hepatitis C antibody   No orders of the defined types were placed in this encounter.

## 2017-01-18 LAB — CERVICOVAGINAL ANCILLARY ONLY
BACTERIAL VAGINITIS: POSITIVE — AB
Candida vaginitis: NEGATIVE
Chlamydia: NEGATIVE
Neisseria Gonorrhea: NEGATIVE
Trichomonas: NEGATIVE

## 2017-01-18 LAB — RPR: RPR: NONREACTIVE

## 2017-01-18 LAB — HEPATITIS C ANTIBODY

## 2017-01-18 LAB — HEPATITIS B SURFACE ANTIGEN: HEP B S AG: NEGATIVE

## 2017-01-18 LAB — HIV ANTIBODY (ROUTINE TESTING W REFLEX): HIV Screen 4th Generation wRfx: NONREACTIVE

## 2017-01-19 ENCOUNTER — Other Ambulatory Visit: Payer: Self-pay | Admitting: Obstetrics

## 2017-01-19 DIAGNOSIS — B9689 Other specified bacterial agents as the cause of diseases classified elsewhere: Secondary | ICD-10-CM

## 2017-01-19 DIAGNOSIS — N760 Acute vaginitis: Principal | ICD-10-CM

## 2017-01-19 MED ORDER — METRONIDAZOLE 500 MG PO TABS: 500.0000 mg | ORAL_TABLET | Freq: Two times a day (BID) | ORAL | 2 refills | Status: DC

## 2017-07-19 ENCOUNTER — Telehealth: Payer: Self-pay | Admitting: *Deleted

## 2017-07-19 NOTE — Telephone Encounter (Signed)
Lft vmail for patient to call back am schedule Annua//pap

## 2017-08-02 ENCOUNTER — Ambulatory Visit (INDEPENDENT_AMBULATORY_CARE_PROVIDER_SITE_OTHER): Payer: BC Managed Care – PPO | Admitting: Obstetrics

## 2017-08-02 ENCOUNTER — Encounter: Payer: Self-pay | Admitting: Obstetrics

## 2017-08-02 VITALS — BP 116/76 | HR 92 | Wt 184.5 lb

## 2017-08-02 DIAGNOSIS — B9689 Other specified bacterial agents as the cause of diseases classified elsewhere: Secondary | ICD-10-CM

## 2017-08-02 DIAGNOSIS — Z01419 Encounter for gynecological examination (general) (routine) without abnormal findings: Secondary | ICD-10-CM

## 2017-08-02 DIAGNOSIS — Z30431 Encounter for routine checking of intrauterine contraceptive device: Secondary | ICD-10-CM

## 2017-08-02 DIAGNOSIS — Z01411 Encounter for gynecological examination (general) (routine) with abnormal findings: Secondary | ICD-10-CM

## 2017-08-02 DIAGNOSIS — N760 Acute vaginitis: Secondary | ICD-10-CM

## 2017-08-02 DIAGNOSIS — Z113 Encounter for screening for infections with a predominantly sexual mode of transmission: Secondary | ICD-10-CM

## 2017-08-02 DIAGNOSIS — Z1151 Encounter for screening for human papillomavirus (HPV): Secondary | ICD-10-CM

## 2017-08-02 DIAGNOSIS — Z124 Encounter for screening for malignant neoplasm of cervix: Secondary | ICD-10-CM

## 2017-08-02 MED ORDER — TINIDAZOLE 500 MG PO TABS: 1000.0000 mg | ORAL_TABLET | Freq: Every day | ORAL | 2 refills | Status: DC

## 2017-08-02 NOTE — Progress Notes (Addendum)
Subjective:        Grace Vazquez is a 32 y.o. female here for a routine exam.  Current complaints: None.    Personal health questionnaire:  Is patient Ashkenazi Jewish, have a family history of breast and/or ovarian cancer: no Is there a family history of uterine cancer diagnosed at age < 57, gastrointestinal cancer, urinary tract cancer, family member who is a Personnel officer syndrome-associated carrier: no Is the patient overweight and hypertensive, family history of diabetes, personal history of gestational diabetes, preeclampsia or PCOS: no Is patient over 84, have PCOS,  family history of premature CHD under age 53, diabetes, smoke, have hypertension or peripheral artery disease:  no At any time, has a partner hit, kicked or otherwise hurt or frightened you?: no Over the past 2 weeks, have you felt down, depressed or hopeless?: no Over the past 2 weeks, have you felt little interest or pleasure in doing things?:no   Gynecologic History No LMP recorded. Patient is not currently having periods (Reason: IUD). Contraception: IUD ( ParaGuard ) Last Pap: 2017. Results were: normal Last mammogram: n/a. Results were: n/a  Obstetric History OB History  Gravida Para Term Preterm AB Living  2 1 1   1 1   SAB TAB Ectopic Multiple Live Births    1   0 1    # Outcome Date GA Lbr Len/2nd Weight Sex Delivery Anes PTL Lv  2 Term 09/18/14 [redacted]w[redacted]d 16:58 / 00:52 8 lb 4 oz (3.742 kg) F Vag-Spont EPI  LIV  1 TAB 2011              Past Medical History:  Diagnosis Date  . Medical history non-contributory     Past Surgical History:  Procedure Laterality Date  . DILATION AND CURETTAGE OF UTERUS       Current Outpatient Medications:  .  tinidazole (TINDAMAX) 500 MG tablet, Take 2 tablets (1,000 mg total) by mouth daily with breakfast., Disp: 10 tablet, Rfl: 2 No Known Allergies  Social History   Tobacco Use  . Smoking status: Never Smoker  . Smokeless tobacco: Never Used  Substance Use Topics   . Alcohol use: No    Alcohol/week: 0.0 oz    Family History  Problem Relation Age of Onset  . Breast cancer Maternal Aunt       Review of Systems  Constitutional: negative for fatigue and weight loss Respiratory: negative for cough and wheezing Cardiovascular: negative for chest pain, fatigue and palpitations Gastrointestinal: negative for abdominal pain and change in bowel habits Musculoskeletal:negative for myalgias Neurological: negative for gait problems and tremors Behavioral/Psych: negative for abusive relationship, depression Endocrine: negative for temperature intolerance    Genitourinary:negative for abnormal menstrual periods, genital lesions, hot flashes, sexual problems; and positive forvaginal discharge Integument/breast: negative for breast lump, breast tenderness, nipple discharge and skin lesion(s)    Objective:       BP 116/76   Pulse 92   Wt 184 lb 8 oz (83.7 kg)   BMI 31.67 kg/m  General:   alert  Skin:   no rash or abnormalities  Lungs:   clear to auscultation bilaterally  Heart:   regular rate and rhythm, S1, S2 normal, no murmur, click, rub or gallop  Breasts:   normal without suspicious masses, skin or nipple changes or axillary nodes  Abdomen:  normal findings: no organomegaly, soft, non-tender and no hernia  Pelvis:  External genitalia: normal general appearance Urinary system: urethral meatus normal and bladder without fullness, nontender  Vaginal: normal without tenderness, induration or masses Cervix: normal appearance Adnexa: normal bimanual exam Uterus: anteverted and non-tender, normal size   Lab Review Urine pregnancy test Labs reviewed yes Radiologic studies reviewed no  50% of 20 min visit spent on counseling and coordination of care.   Assessment:     1. Encounter for routine gynecological examination with Papanicolaou smear of cervix Rx: - Cytology - PAP - Cervicovaginal ancillary only  2. BV (bacterial vaginosis) Rx: -  tinidazole (TINDAMAX) 500 MG tablet; Take 2 tablets (1,000 mg total) by mouth daily with breakfast.  Dispense: 10 tablet; Refill: 2  3. Encounter for routine checking of intrauterine contraceptive device (IUD) - pleased with ParaGuard    Plan:    Education reviewed: calcium supplements, depression evaluation, low fat, low cholesterol diet, safe sex/STD prevention, self breast exams and weight bearing exercise. Contraception: IUD. Follow up in: 1 year.   Meds ordered this encounter  Medications  . tinidazole (TINDAMAX) 500 MG tablet    Sig: Take 2 tablets (1,000 mg total) by mouth daily with breakfast.    Dispense:  10 tablet    Refill:  2   No orders of the defined types were placed in this encounter.   Brock BadHARLES A. Deloros Beretta MD

## 2017-08-02 NOTE — Progress Notes (Signed)
Patient is in the office for annual, last pap 04-26-16.

## 2017-08-05 LAB — CERVICOVAGINAL ANCILLARY ONLY
BACTERIAL VAGINITIS: NEGATIVE
CANDIDA VAGINITIS: POSITIVE — AB
Chlamydia: NEGATIVE
NEISSERIA GONORRHEA: NEGATIVE
Trichomonas: POSITIVE — AB

## 2017-08-06 ENCOUNTER — Other Ambulatory Visit: Payer: Self-pay | Admitting: Obstetrics

## 2017-08-06 DIAGNOSIS — A5901 Trichomonal vulvovaginitis: Secondary | ICD-10-CM

## 2017-08-06 DIAGNOSIS — B373 Candidiasis of vulva and vagina: Secondary | ICD-10-CM

## 2017-08-06 DIAGNOSIS — B3731 Acute candidiasis of vulva and vagina: Secondary | ICD-10-CM

## 2017-08-06 LAB — CYTOLOGY - PAP: HPV: DETECTED — AB

## 2017-08-06 MED ORDER — TINIDAZOLE 500 MG PO TABS: 2.0000 g | ORAL_TABLET | Freq: Once | ORAL | 0 refills | Status: AC

## 2017-08-06 MED ORDER — FLUCONAZOLE 150 MG PO TABS: 150.0000 mg | ORAL_TABLET | Freq: Once | ORAL | 0 refills | Status: DC

## 2017-08-07 ENCOUNTER — Other Ambulatory Visit: Payer: Self-pay | Admitting: Obstetrics

## 2017-08-12 ENCOUNTER — Other Ambulatory Visit: Payer: Self-pay | Admitting: Obstetrics

## 2017-08-12 DIAGNOSIS — B3731 Acute candidiasis of vulva and vagina: Secondary | ICD-10-CM

## 2017-08-12 DIAGNOSIS — B373 Candidiasis of vulva and vagina: Secondary | ICD-10-CM

## 2017-11-06 ENCOUNTER — Other Ambulatory Visit (HOSPITAL_COMMUNITY)
Admission: RE | Admit: 2017-11-06 | Discharge: 2017-11-06 | Disposition: A | Discharge status: Home / Self Care | Payer: BC Managed Care – PPO | Source: Ambulatory Visit | Attending: Obstetrics | Admitting: Obstetrics

## 2017-11-06 ENCOUNTER — Ambulatory Visit (INDEPENDENT_AMBULATORY_CARE_PROVIDER_SITE_OTHER): Payer: BC Managed Care – PPO | Admitting: Obstetrics

## 2017-11-06 ENCOUNTER — Encounter: Payer: Self-pay | Admitting: Obstetrics

## 2017-11-06 VITALS — BP 126/83 | HR 97 | Wt 197.4 lb

## 2017-11-06 DIAGNOSIS — Z202 Contact with and (suspected) exposure to infections with a predominantly sexual mode of transmission: Secondary | ICD-10-CM | POA: Diagnosis not present

## 2017-11-06 DIAGNOSIS — N898 Other specified noninflammatory disorders of vagina: Secondary | ICD-10-CM

## 2017-11-06 DIAGNOSIS — Z113 Encounter for screening for infections with a predominantly sexual mode of transmission: Secondary | ICD-10-CM | POA: Diagnosis not present

## 2017-11-06 NOTE — Progress Notes (Signed)
Patient ID: Grace Vazquez, female   DOB: 1985/08/08, 32 y.o.   MRN: [id number]  Chief Complaint  Patient presents with  . Follow-up    TOC    HPI Grace Vazquez is a 32 y.o. female.  History of Trichomonas vaginitis, treated.  Presents for TOC.  No complaints. HPI  Past Medical History:  Diagnosis Date  . Medical history non-contributory     Past Surgical History:  Procedure Laterality Date  . DILATION AND CURETTAGE OF UTERUS      Family History  Problem Relation Age of Onset  . Breast cancer Maternal Aunt     Social History Social History   Tobacco Use  . Smoking status: Never Smoker  . Smokeless tobacco: Never Used  Substance Use Topics  . Alcohol use: No    Alcohol/week: 0.0 oz  . Drug use: No    No Known Allergies  Current Outpatient Medications  Medication Sig Dispense Refill  . fluconazole (DIFLUCAN) 150 MG tablet TAKE 1 TABLET BY MOUTH AS ONE DOSE (Patient not taking: Reported on 11/06/2017) 1 tablet 0  . tinidazole (TINDAMAX) 500 MG tablet Take 2 tablets (1,000 mg total) by mouth daily with breakfast. (Patient not taking: Reported on 11/06/2017) 10 tablet 2   No current facility-administered medications for this visit.     Review of Systems Review of Systems Constitutional: negative for fatigue and weight loss Respiratory: negative for cough and wheezing Cardiovascular: negative for chest pain, fatigue and palpitations Gastrointestinal: negative for abdominal pain and change in bowel habits Genitourinary:negative Integument/breast: negative for nipple discharge Musculoskeletal:negative for myalgias Neurological: negative for gait problems and tremors Behavioral/Psych: negative for abusive relationship, depression Endocrine: negative for temperature intolerance      Blood pressure 126/83, pulse 97, weight 197 lb 6.4 oz (89.5 kg), last menstrual period 10/15/2017.  Physical Exam Physical Exam            Abdomen:  normal findings: no  organomegaly, soft, non-tender and no hernia  Pelvis:  External genitalia: normal general appearance Urinary system: urethral meatus normal and bladder without fullness, nontender Vaginal: normal without tenderness, induration or masses Cervix: normal appearance Adnexa: normal bimanual exam Uterus: anteverted and non-tender, normal size    50% of 15 min visit spent on counseling and coordination of care.   Data Reviewed Wet Prep Cultures  Assessment     1. Trichomonas contact, treated Rx: - Cervicovaginal ancillary only    Plan    Follow up in 9 months  No orders of the defined types were placed in this encounter.  No orders of the defined types were placed in this encounter.   Brock Bad MD 11-06-2017

## 2017-11-06 NOTE — Progress Notes (Signed)
Presents for [facility].

## 2017-11-07 ENCOUNTER — Encounter: Payer: Self-pay | Admitting: Obstetrics

## 2017-11-08 LAB — CERVICOVAGINAL ANCILLARY ONLY
Bacterial vaginitis: POSITIVE — AB
Candida vaginitis: NEGATIVE
Chlamydia: NEGATIVE
Neisseria Gonorrhea: NEGATIVE
Trichomonas: NEGATIVE

## 2017-11-09 ENCOUNTER — Other Ambulatory Visit: Payer: Self-pay | Admitting: Obstetrics

## 2017-11-09 DIAGNOSIS — N760 Acute vaginitis: Principal | ICD-10-CM

## 2017-11-09 DIAGNOSIS — B9689 Other specified bacterial agents as the cause of diseases classified elsewhere: Secondary | ICD-10-CM

## 2017-11-09 MED ORDER — TINIDAZOLE 500 MG PO TABS: 1000.0000 mg | ORAL_TABLET | Freq: Every day | ORAL | 2 refills | Status: DC

## 2018-01-15 ENCOUNTER — Encounter: Payer: Self-pay | Admitting: Obstetrics

## 2018-01-15 ENCOUNTER — Ambulatory Visit (INDEPENDENT_AMBULATORY_CARE_PROVIDER_SITE_OTHER): Payer: BC Managed Care – PPO | Admitting: Obstetrics

## 2018-01-15 ENCOUNTER — Other Ambulatory Visit (HOSPITAL_COMMUNITY)
Admission: RE | Admit: 2018-01-15 | Discharge: 2018-01-15 | Disposition: A | Discharge status: Home / Self Care | Payer: BC Managed Care – PPO | Source: Ambulatory Visit | Attending: Obstetrics | Admitting: Obstetrics

## 2018-01-15 VITALS — BP 126/82 | HR 79 | Ht 64.0 in | Wt 204.7 lb

## 2018-01-15 DIAGNOSIS — Z113 Encounter for screening for infections with a predominantly sexual mode of transmission: Secondary | ICD-10-CM | POA: Diagnosis not present

## 2018-01-15 DIAGNOSIS — N898 Other specified noninflammatory disorders of vagina: Secondary | ICD-10-CM

## 2018-01-15 DIAGNOSIS — R87611 Atypical squamous cells cannot exclude high grade squamous intraepithelial lesion on cytologic smear of cervix (ASC-H): Secondary | ICD-10-CM | POA: Insufficient documentation

## 2018-01-15 NOTE — Progress Notes (Signed)
Patient is in the office for colpo procedure.  

## 2018-01-15 NOTE — Progress Notes (Signed)
Colposcopy Procedure Note  Indications: Pap smear 5 months ago showed: ASC cannot exclude high grade lesion [facility]). The prior pap showed no abnormalities.  Prior cervical/vaginal disease: normal exam without visible pathology. Prior cervical treatment: no treatment.  Procedure Details  The risks and benefits of the procedure and Written informed consent obtained.  A time-out was performed confirming the patient, procedure and allergy status  Speculum placed in vagina and excellent visualization of cervix achieved, cervix swabbed x 3 with acetic acid solution.  Findings: Cervix: no mosaicism, no punctation, no abnormal vasculature, acetowhite lesion(s) noted at 6 o'clock and punctation noted at 6 o'clock; SCJ visualized 360 degrees without lesions, endocervical curettage performed, cervical biopsies taken at 6 o'clock, specimen labelled and sent to pathology and hemostasis achieved with silver nitrate.   Vaginal inspection: normal without visible lesions. Vulvar colposcopy: vulvar colposcopy not performed.      Specimens: ECC and Cervical Biopsies  Complications: none.  Plan: Specimens labelled and sent to Pathology. Will base further treatment on Pathology findings. Post biopsy instructions given to patient. Return to discuss Pathology results in 2 weeks    Brock BadHARLES A. HARPER MD 01-15-2018.

## 2018-01-16 ENCOUNTER — Other Ambulatory Visit: Payer: Self-pay | Admitting: Obstetrics

## 2018-01-16 DIAGNOSIS — N760 Acute vaginitis: Principal | ICD-10-CM

## 2018-01-16 DIAGNOSIS — B9689 Other specified bacterial agents as the cause of diseases classified elsewhere: Secondary | ICD-10-CM

## 2018-01-16 LAB — CERVICOVAGINAL ANCILLARY ONLY
Bacterial vaginitis: POSITIVE — AB
CHLAMYDIA, DNA PROBE: NEGATIVE
Candida vaginitis: NEGATIVE
Neisseria Gonorrhea: NEGATIVE
Trichomonas: NEGATIVE

## 2018-01-16 MED ORDER — TINIDAZOLE 500 MG PO TABS: 1000.0000 mg | ORAL_TABLET | Freq: Every day | ORAL | 2 refills | Status: DC

## 2018-01-22 ENCOUNTER — Encounter: Payer: Self-pay | Admitting: *Deleted

## 2018-01-29 ENCOUNTER — Encounter: Payer: Self-pay | Admitting: Obstetrics

## 2018-01-29 ENCOUNTER — Ambulatory Visit (INDEPENDENT_AMBULATORY_CARE_PROVIDER_SITE_OTHER): Payer: BC Managed Care – PPO | Admitting: Obstetrics

## 2018-01-29 VITALS — BP 112/68 | HR 80 | Ht 64.0 in | Wt 205.7 lb

## 2018-01-29 DIAGNOSIS — A5901 Trichomonal vulvovaginitis: Secondary | ICD-10-CM | POA: Diagnosis not present

## 2018-01-29 DIAGNOSIS — N760 Acute vaginitis: Secondary | ICD-10-CM | POA: Diagnosis not present

## 2018-01-29 DIAGNOSIS — B9689 Other specified bacterial agents as the cause of diseases classified elsewhere: Secondary | ICD-10-CM

## 2018-01-29 DIAGNOSIS — R87611 Atypical squamous cells cannot exclude high grade squamous intraepithelial lesion on cytologic smear of cervix (ASC-H): Secondary | ICD-10-CM

## 2018-01-29 NOTE — Progress Notes (Signed)
Patient ID: Grace Vazquez, female   DOB: January 09, 1986, 32 y.o.   MRN: [id number]  Chief Complaint  Patient presents with  . Follow-up    results    HPI Grace Vazquez is a 32 y.o. female.  History of ASC-H.  Colposcopy negative for dysplasia, but densely inflamed with trichomonas cervicitis. HPI  Past Medical History:  Diagnosis Date  . Medical history non-contributory     Past Surgical History:  Procedure Laterality Date  . DILATION AND CURETTAGE OF UTERUS      Family History  Problem Relation Age of Onset  . Breast cancer Maternal Aunt     Social History Social History   Tobacco Use  . Smoking status: Never Smoker  . Smokeless tobacco: Never Used  Substance Use Topics  . Alcohol use: No    Alcohol/week: 0.0 oz  . Drug use: No    No Known Allergies  Current Outpatient Medications  Medication Sig Dispense Refill  . fluconazole (DIFLUCAN) 150 MG tablet TAKE 1 TABLET BY MOUTH AS ONE DOSE (Patient not taking: Reported on 11/06/2017) 1 tablet 0  . tinidazole (TINDAMAX) 500 MG tablet Take 2 tablets (1,000 mg total) by mouth daily with breakfast. 10 tablet 2   No current facility-administered medications for this visit.     Review of Systems Review of Systems Constitutional: negative for fatigue and weight loss Respiratory: negative for cough and wheezing Cardiovascular: negative for chest pain, fatigue and palpitations Gastrointestinal: negative for abdominal pain and change in bowel habits Genitourinary:negative Integument/breast: negative for nipple discharge Musculoskeletal:negative for myalgias Neurological: negative for gait problems and tremors Behavioral/Psych: negative for abusive relationship, depression Endocrine: negative for temperature intolerance      Blood pressure 112/68, pulse 80, height 5\' 4"  (1.626 m), weight 205 lb 11.2 oz (93.3 kg), last menstrual period 01/06/2018.  Physical Exam Physical Exam:  Deferred  >50% of 10 min visit spent on  counseling and coordination of care.   Data Reviewed Wet Prep Pathology  Assessment     1. Atypical squamous cells cannot exclude high grade squamous intraepithelial lesion on cytologic smear of cervix (ASC-H) - negative colposcopy for dysplasia  2. Trichomonas vaginitis - treated, and partner also treated. - had negative TOC  3. BV (bacterial vaginosis) - currently being treated    Plan    Follow up in 6 months for pap smear and wet prep   No orders of the defined types were placed in this encounter.  No orders of the defined types were placed in this encounter.   Brock BadHARLES A. HARPER MD 01-29-2018

## 2018-01-29 NOTE — Progress Notes (Signed)
Presents for FU to Colpo.

## 2018-10-22 ENCOUNTER — Other Ambulatory Visit: Payer: Self-pay | Admitting: *Deleted

## 2018-10-22 DIAGNOSIS — B9689 Other specified bacterial agents as the cause of diseases classified elsewhere: Secondary | ICD-10-CM

## 2018-10-22 DIAGNOSIS — N760 Acute vaginitis: Principal | ICD-10-CM

## 2018-10-22 MED ORDER — TINIDAZOLE 500 MG PO TABS: 1000.0000 mg | ORAL_TABLET | Freq: Every day | ORAL | 0 refills | Status: DC

## 2018-10-22 NOTE — Progress Notes (Signed)
Pt called to office with symptoms of BV and request tx. Tinidazole sent to pharmacy as previously prescribed.

## 2019-01-20 ENCOUNTER — Other Ambulatory Visit: Payer: Self-pay

## 2019-01-20 ENCOUNTER — Ambulatory Visit (INDEPENDENT_AMBULATORY_CARE_PROVIDER_SITE_OTHER): Payer: BC Managed Care – PPO | Admitting: Obstetrics

## 2019-01-20 ENCOUNTER — Encounter: Payer: Self-pay | Admitting: Obstetrics

## 2019-01-20 VITALS — BP 118/81 | HR 80 | Wt 215.7 lb

## 2019-01-20 DIAGNOSIS — B9689 Other specified bacterial agents as the cause of diseases classified elsewhere: Secondary | ICD-10-CM | POA: Diagnosis not present

## 2019-01-20 DIAGNOSIS — N760 Acute vaginitis: Secondary | ICD-10-CM | POA: Diagnosis not present

## 2019-01-20 DIAGNOSIS — N898 Other specified noninflammatory disorders of vagina: Secondary | ICD-10-CM

## 2019-01-20 DIAGNOSIS — Z30431 Encounter for routine checking of intrauterine contraceptive device: Secondary | ICD-10-CM

## 2019-01-20 DIAGNOSIS — Z113 Encounter for screening for infections with a predominantly sexual mode of transmission: Secondary | ICD-10-CM | POA: Diagnosis not present

## 2019-01-20 DIAGNOSIS — Z01419 Encounter for gynecological examination (general) (routine) without abnormal findings: Secondary | ICD-10-CM

## 2019-01-20 DIAGNOSIS — E669 Obesity, unspecified: Secondary | ICD-10-CM

## 2019-01-20 DIAGNOSIS — Z124 Encounter for screening for malignant neoplasm of cervix: Secondary | ICD-10-CM

## 2019-01-20 DIAGNOSIS — B373 Candidiasis of vulva and vagina: Secondary | ICD-10-CM | POA: Diagnosis not present

## 2019-01-20 NOTE — Progress Notes (Signed)
Pt is here for annual gyn exam. Last 08/02/2017, ASCUS HPV+. Normal colpo on 01/15/2018. Pt requests STI screening.

## 2019-01-20 NOTE — Progress Notes (Signed)
Subjective:        Grace ChouStephanie Vazquez is a 33 y.o. female here for a routine exam.  Current complaints: None.    Personal health questionnaire:  Is patient Ashkenazi Jewish, have a family history of breast and/or ovarian cancer: yes Is there a family history of uterine cancer diagnosed at age < 4350, gastrointestinal cancer, urinary tract cancer, family member who is a Personnel officerLynch syndrome-associated carrier: no Is the patient overweight and hypertensive, family history of diabetes, personal history of gestational diabetes, preeclampsia or PCOS: no Is patient over 3755, have PCOS,  family history of premature CHD under age 31, diabetes, smoke, have hypertension or peripheral artery disease:  no At any time, has a partner hit, kicked or otherwise hurt or frightened you?: no Over the past 2 weeks, have you felt down, depressed or hopeless?: no Over the past 2 weeks, have you felt little interest or pleasure in doing things?:no   Gynecologic History Patient's last menstrual period was 12/21/2018 (approximate). Contraception: IUD Last Pap: 08-02-2017. Results were: ASC-H with + HPV.  Colposcopy: Negative for Dysplasia.  Positive for inflammation. Last mammogram: n/a. Results were: n/a  Obstetric History OB History  Gravida Para Term Preterm AB Living  2 1 1   1 1   SAB TAB Ectopic Multiple Live Births    1   0 1    # Outcome Date GA Lbr Len/2nd Weight Sex Delivery Anes PTL Lv  2 Term 09/18/14 [id number] 16:58 / 00:52 8 lb 4 oz (3.742 kg) F Vag-Spont EPI  LIV  1 TAB 2011            Past Medical History:  Diagnosis Date  . Medical history non-contributory     Past Surgical History:  Procedure Laterality Date  . DILATION AND CURETTAGE OF UTERUS       Current Outpatient Medications:  .  fluconazole (DIFLUCAN) 150 MG tablet, TAKE 1 TABLET BY MOUTH AS ONE DOSE (Patient not taking: Reported on 11/06/2017), Disp: 1 tablet, Rfl: 0 .  tinidazole (TINDAMAX) 500 MG tablet, Take 2 tablets (1,000 mg total)  by mouth daily with breakfast. (Patient not taking: Reported on 01/20/2019), Disp: 10 tablet, Rfl: 0 No Known Allergies  Social History   Tobacco Use  . Smoking status: Never Smoker  . Smokeless tobacco: Never Used  Substance Use Topics  . Alcohol use: No    Alcohol/week: 0.0 standard drinks    Family History  Problem Relation Age of Onset  . Breast cancer Maternal Aunt       Review of Systems  Constitutional: negative for fatigue and weight loss Respiratory: negative for cough and wheezing Cardiovascular: negative for chest pain, fatigue and palpitations Gastrointestinal: negative for abdominal pain and change in bowel habits Musculoskeletal:negative for myalgias Neurological: negative for gait problems and tremors Behavioral/Psych: negative for abusive relationship, depression Endocrine: negative for temperature intolerance    Genitourinary:negative for abnormal menstrual periods, genital lesions, hot flashes, sexual problems and vaginal discharge Integument/breast: negative for breast lump, breast tenderness, nipple discharge and skin lesion(s)    Objective:       BP 118/81   Pulse 80   Wt 215 lb 11.2 oz (97.8 kg)   LMP 12/21/2018 (Approximate)   BMI 37.02 kg/m  General:   alert  Skin:   no rash or abnormalities  Lungs:   clear to auscultation bilaterally  Heart:   regular rate and rhythm, S1, S2 normal, no murmur, click, rub or gallop  Breasts:   normal  without suspicious masses, skin or nipple changes or axillary nodes  Abdomen:  normal findings: no organomegaly, soft, non-tender and no hernia  Pelvis:  External genitalia: normal general appearance Urinary system: urethral meatus normal and bladder without fullness, nontender Vaginal: normal without tenderness, induration or masses Cervix: normal appearance Adnexa: normal bimanual exam Uterus: anteverted and non-tender, normal size   Lab Review Urine pregnancy test Labs reviewed yes Radiologic studies  reviewed no  50% of 25 min visit spent on counseling and coordination of care.   Assessment:     1. Encounter for gynecological examination with Papanicolaou smear of cervix Rx: - Cytology - PAP( [place])  2. Vaginal discharge Rx: - Cervicovaginal ancillary only( [place])  3. Screen for STD (sexually transmitted disease) Rx: - Hepatitis C antibody - Hepatitis B surface antigen - HIV Antibody (routine testing w rflx) - RPR  4. Encounter for routine checking of intrauterine contraceptive device (IUD) - pleased with ParaGuard IUD  5. Obesity (BMI 35.0-39.9 without comorbidity) - program of caloric reduction, exercise and behavioral modification recommended    Plan:    Education reviewed: calcium supplements, depression evaluation, low fat, low cholesterol diet, safe sex/STD prevention, self breast exams and weight bearing exercise. Contraception: IUD. Follow up in: 1 year.   No orders of the defined types were placed in this encounter.  Orders Placed This Encounter  Procedures  . Hepatitis C antibody  . Hepatitis B surface antigen  . HIV Antibody (routine testing w rflx)  . RPR    Shelly Bombard MD 01-20-2019

## 2019-01-21 LAB — HEPATITIS B SURFACE ANTIGEN: Hepatitis B Surface Ag: NEGATIVE

## 2019-01-21 LAB — HIV ANTIBODY (ROUTINE TESTING W REFLEX): HIV Screen 4th Generation wRfx: NONREACTIVE

## 2019-01-21 LAB — HEPATITIS C ANTIBODY: Hep C Virus Ab: 0.1 s/co ratio (ref 0.0–0.9)

## 2019-01-21 LAB — RPR: RPR Ser Ql: NONREACTIVE

## 2019-01-22 LAB — CERVICOVAGINAL ANCILLARY ONLY
Bacterial vaginitis: POSITIVE — AB
Candida vaginitis: POSITIVE — AB
Chlamydia: NEGATIVE
Neisseria Gonorrhea: NEGATIVE
Trichomonas: NEGATIVE

## 2019-01-23 ENCOUNTER — Other Ambulatory Visit: Payer: Self-pay | Admitting: Obstetrics

## 2019-01-23 DIAGNOSIS — B9689 Other specified bacterial agents as the cause of diseases classified elsewhere: Secondary | ICD-10-CM

## 2019-01-23 DIAGNOSIS — N760 Acute vaginitis: Secondary | ICD-10-CM

## 2019-01-23 DIAGNOSIS — B3731 Acute candidiasis of vulva and vagina: Secondary | ICD-10-CM

## 2019-01-23 DIAGNOSIS — B373 Candidiasis of vulva and vagina: Secondary | ICD-10-CM

## 2019-01-23 LAB — CYTOLOGY - PAP: Diagnosis: HIGH — AB

## 2019-01-23 MED ORDER — FLUCONAZOLE 150 MG PO TABS: ORAL_TABLET | ORAL | 0 refills | Status: DC

## 2019-01-23 MED ORDER — TINIDAZOLE 500 MG PO TABS: 1000.0000 mg | ORAL_TABLET | Freq: Every day | ORAL | 0 refills | Status: DC

## 2019-02-19 ENCOUNTER — Encounter: Payer: Self-pay | Admitting: Obstetrics and Gynecology

## 2019-02-19 ENCOUNTER — Ambulatory Visit (INDEPENDENT_AMBULATORY_CARE_PROVIDER_SITE_OTHER): Payer: BC Managed Care – PPO | Admitting: Obstetrics and Gynecology

## 2019-02-19 ENCOUNTER — Other Ambulatory Visit: Payer: Self-pay

## 2019-02-19 ENCOUNTER — Other Ambulatory Visit (HOSPITAL_COMMUNITY)
Admission: RE | Admit: 2019-02-19 | Discharge: 2019-02-19 | Disposition: A | Discharge status: Home / Self Care | Payer: BC Managed Care – PPO | Source: Ambulatory Visit | Attending: Obstetrics and Gynecology | Admitting: Obstetrics and Gynecology

## 2019-02-19 VITALS — BP 123/82 | HR 80 | Temp 97.6°F | Wt 211.0 lb

## 2019-02-19 DIAGNOSIS — R87613 High grade squamous intraepithelial lesion on cytologic smear of cervix (HGSIL): Secondary | ICD-10-CM

## 2019-02-19 DIAGNOSIS — N870 Mild cervical dysplasia: Secondary | ICD-10-CM

## 2019-02-19 NOTE — Progress Notes (Signed)
Colposcopy Procedure Note  Grace Vazquez is a 33 y.o. G2P1011 here for colposcopy.  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 IUD in place    The risks (including infection, bleeding, pain) and benefits of the procedure were explained to the patient and written informed consent was obtained.  The patient was placed in the dorsal lithotomy position. A Graves' was speculum inserted in the vagina, and the cervix was visualized.  The cervix was stained with acetic acid and visualized using the colposcope under magnification as well as with a green filter. Findings as below. Cervical biopsies were taken at 3, 6, 9, 12 o'clock. Endocervical curettage then performed in all four quadrants. Small amount of bleeding noted that improved with pressure. Patient tolerated procedure well.  Findings: acetowhite at upper SCJ and 9 o'clock, no lesions  Impression: low grade  Adequate: yes  Specimens:  1. 12, 3, 6, 9 o'clock 2. Endocervical curettage  Condition: Stable  Complications: None  Plan: The patient was advised to call for any fever or for prolonged or severe pain or bleeding. She was advised to use OTC analgesics as needed for mild to moderate pain. She was advised to avoid vaginal intercourse for 48 hours or until the bleeding has completely stopped.  Will base further management on results of biopsy.   Feliz Beam, M.D. Attending Center for [facility] [employer])

## 2019-02-19 NOTE — Progress Notes (Signed)
RGYN patient presents for Colpo today. Denies any unprotected intercourse x 14 days.   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 Pt c/o vaginal discharge.   UPT : NEGATIVE

## 2019-04-16 ENCOUNTER — Encounter: Payer: Self-pay | Admitting: Obstetrics and Gynecology

## 2019-04-16 ENCOUNTER — Telehealth (INDEPENDENT_AMBULATORY_CARE_PROVIDER_SITE_OTHER): Payer: BC Managed Care – PPO | Admitting: Obstetrics and Gynecology

## 2019-04-16 DIAGNOSIS — N870 Mild cervical dysplasia: Secondary | ICD-10-CM | POA: Diagnosis not present

## 2019-04-16 NOTE — Progress Notes (Signed)
S/w pt for mychart visit. Pt is following up to discuss results from 02-19-19 visit.

## 2019-04-17 ENCOUNTER — Encounter: Payer: Self-pay | Admitting: Obstetrics and Gynecology

## 2019-04-17 NOTE — Progress Notes (Signed)
    TELEHEALTH GYNECOLOGY VIRTUAL VIDEO VISIT ENCOUNTER NOTE  Provider location: [facility] for [facility] at [place]   I connected with Grace Vazquez on 04/17/19 at  9:00 AM EDT by MyChart Video Encounter at home and verified that I am speaking with the correct person using two identifiers.   I discussed the limitations, risks, security and privacy concerns of performing an evaluation and management service virtually and the availability of in person appointments. I also discussed with the patient that there may be a patient responsible charge related to this service. The patient expressed understanding and agreed to proceed.   History:  Grace Vazquez is a 33 y.o. G13P1011 female being evaluated today for follow up for colposcopy. She is doing well, had minimal pain after procedure. She denies any abnormal vaginal discharge, bleeding, pelvic pain or other concerns.       Past Medical History:  Diagnosis Date  . Medical history non-contributory    Past Surgical History:  Procedure Laterality Date  . DILATION AND CURETTAGE OF UTERUS     The following portions of the patient's history were reviewed and updated as appropriate: allergies, current medications, past family history, past medical history, past social history, past surgical history and problem list.    Review of Systems:  Pertinent items noted in HPI and remainder of comprehensive ROS otherwise negative.  Physical Exam:   General:  Alert, oriented and cooperative. Patient appears to be in no acute distress.  Mental Status: Normal mood and affect. Normal behavior. Normal judgment and thought content.   Respiratory: Normal respiratory effort, no problems with respiration noted  Rest of physical exam deferred due to type of encounter  Labs and Imaging No results found for this or any previous visit (from the past 336 hour(s)). No results found.     Assessment and Plan:   1. Dysplasia of cervix, low grade (CIN 1)  Patient with ASC-H then CIN2/3 on pap and subsequent CIN1 on adequate colposcopy with negative endocervical curettage. Per 2020 guidelines, next step would be EITHER diagnostic excisional procedure or repeat cytology + HPV in one year. I reviewed the above with the patient and reviewed risks/benefits of both options, including risk of progression of disease and possibility that CIN 2/3 was missed on colposcopy. Reviewed risks of LEEP including risk of pre-term delivery in future. Reviewed benefits of both options including excising CIN versus possible un-needed LEEP. After answering all questions, patient would like to wait on excisional procedure and repeat pap in 6 months, which is reasonable. Will plan for repeat pap 06/2019 as it was originally done 12/2018. She is agreeable to this plan.    I discussed the assessment and treatment plan with the patient. The patient was provided an opportunity to ask questions and all were answered. The patient agreed with the plan and demonstrated an understanding of the instructions.   The patient was advised to call back or seek an in-person evaluation/go to the ED if the symptoms worsen or if the condition fails to improve as anticipated.  I provided 15 minutes of face-to-face time during this encounter.   Sloan Leiter, MD [facility] for [place], [place]

## 2019-06-10 ENCOUNTER — Other Ambulatory Visit: Payer: Self-pay

## 2019-06-10 ENCOUNTER — Ambulatory Visit: Payer: BC Managed Care – PPO | Attending: Internal Medicine

## 2019-06-10 DIAGNOSIS — Z20822 Contact with and (suspected) exposure to covid-19: Secondary | ICD-10-CM

## 2019-06-13 LAB — NOVEL CORONAVIRUS, NAA: SARS-CoV-2, NAA: NOT DETECTED

## 2019-07-29 ENCOUNTER — Encounter: Payer: Self-pay | Admitting: Obstetrics

## 2019-07-29 ENCOUNTER — Ambulatory Visit (INDEPENDENT_AMBULATORY_CARE_PROVIDER_SITE_OTHER): Payer: BC Managed Care – PPO | Admitting: Obstetrics

## 2019-07-29 ENCOUNTER — Other Ambulatory Visit: Payer: Self-pay

## 2019-07-29 VITALS — BP 118/78 | HR 65 | Ht 64.0 in | Wt 203.7 lb

## 2019-07-29 DIAGNOSIS — N870 Mild cervical dysplasia: Secondary | ICD-10-CM | POA: Diagnosis not present

## 2019-07-29 DIAGNOSIS — N898 Other specified noninflammatory disorders of vagina: Secondary | ICD-10-CM | POA: Diagnosis not present

## 2019-07-29 DIAGNOSIS — N939 Abnormal uterine and vaginal bleeding, unspecified: Secondary | ICD-10-CM

## 2019-07-29 DIAGNOSIS — E669 Obesity, unspecified: Secondary | ICD-10-CM | POA: Diagnosis not present

## 2019-07-29 DIAGNOSIS — B9689 Other specified bacterial agents as the cause of diseases classified elsewhere: Secondary | ICD-10-CM | POA: Diagnosis not present

## 2019-07-29 DIAGNOSIS — N760 Acute vaginitis: Secondary | ICD-10-CM | POA: Diagnosis not present

## 2019-07-29 DIAGNOSIS — Z01419 Encounter for gynecological examination (general) (routine) without abnormal findings: Secondary | ICD-10-CM

## 2019-07-29 DIAGNOSIS — B373 Candidiasis of vulva and vagina: Secondary | ICD-10-CM | POA: Diagnosis not present

## 2019-07-29 DIAGNOSIS — Z1151 Encounter for screening for human papillomavirus (HPV): Secondary | ICD-10-CM | POA: Diagnosis not present

## 2019-07-29 DIAGNOSIS — Z113 Encounter for screening for infections with a predominantly sexual mode of transmission: Secondary | ICD-10-CM

## 2019-07-29 MED ORDER — BALCOLTRA 0.1-20 MG-MCG(21) PO TABS: 1.0000 | ORAL_TABLET | Freq: Every day | ORAL | 2 refills | Status: DC

## 2019-07-29 NOTE — Progress Notes (Addendum)
Patient ID: Addalee Kavanagh, female   DOB: October 12, 1985, 34 y.o.   MRN: [id number]  Chief Complaint  Patient presents with  . GYN    HPI Ambrea Hegler is a 34 y.o. female.  History of ASC-N then HGSIL paps, and colposcopy showed LGSIL.  She was given the option of following pap smear again in 6 months or proceed to LEEP procedure, per 2020 guidelines.  Patient elected to repeat pap.  She also complains of irregular vaginal bleeding with the ParaGuard IUD in place. HPI  Past Medical History:  Diagnosis Date  . Medical history non-contributory     Past Surgical History:  Procedure Laterality Date  . DILATION AND CURETTAGE OF UTERUS      Family History  Problem Relation Age of Onset  . Breast cancer Maternal Aunt     Social History Social History   Tobacco Use  . Smoking status: Never Smoker  . Smokeless tobacco: Never Used  Substance Use Topics  . Alcohol use: No    Alcohol/week: 0.0 standard drinks  . Drug use: No    No Known Allergies  Current Outpatient Medications  Medication Sig Dispense Refill  . fluconazole (DIFLUCAN) 150 MG tablet TAKE 1 TABLET BY MOUTH AS ONE DOSE (Patient not taking: Reported on 02/19/2019) 1 tablet 0  . Levonorgest-Eth Estrad-Fe Bisg (BALCOLTRA) 0.1-20 MG-MCG(21) TABS Take 1 tablet by mouth daily. 28 tablet 2  . tinidazole (TINDAMAX) 500 MG tablet Take 2 tablets (1,000 mg total) by mouth daily with breakfast. (Patient not taking: Reported on 02/19/2019) 10 tablet 0   No current facility-administered medications for this visit.    Review of Systems Review of Systems Constitutional: negative for fatigue and weight loss Respiratory: negative for cough and wheezing Cardiovascular: negative for chest pain, fatigue and palpitations Gastrointestinal: negative for abdominal pain and change in bowel habits Genitourinary: positive for irregular vaginal bleeding Integument/breast: negative for nipple discharge Musculoskeletal:negative for  myalgias Neurological: negative for gait problems and tremors Behavioral/Psych: negative for abusive relationship, depression Endocrine: negative for temperature intolerance      Blood pressure 118/78, pulse 65, height 5\' 4"  (1.626 m), weight 203 lb 11.2 oz (92.4 kg).  Physical Exam Physical Exam General:   alert and no distress  Skin:   no rash or abnormalities  Lungs:   clear to auscultation bilaterally  Heart:   regular rate and rhythm, S1, S2 normal, no murmur, click, rub or gallop  Breasts:   normal without suspicious masses, skin or nipple changes or axillary nodes  Abdomen:  normal findings: no organomegaly, soft, non-tender and no hernia  Pelvis:  External genitalia: normal general appearance Urinary system: urethral meatus normal and bladder without fullness, nontender Vaginal: normal without tenderness, induration or masses Cervix: normal appearance Adnexa: normal bimanual exam Uterus: anteverted and non-tender, normal size    50% of 20 min visit spent on counseling and coordination of care.   Data Reviewed Pathology  Assessment       1. Encounter for routine gynecological examination with Papanicolaou smear of cervix Rx: - Cytology - PAP( [place])  2. Dysplasia of cervix, low grade (CIN 1) - pap smear done - will proceed with LEEP if pap is > LGSIL  3. Vaginal discharge Rx: - Cervicovaginal ancillary only( [place])  4. Abnormal uterine bleeding (AUB) Rx: - Levonorgest-Eth Estrad-Fe Bisg (BALCOLTRA) 0.1-20 MG-MCG(21) TABS; Take 1 tablet by mouth daily.  Dispense: 28 tablet; Refill: 2  5. Obesity (BMI 35.0-39.9 without comorbidity) - program of caloric  restriction, exercise and behavioral modification recommended   Plan    Repeat pap in 6 months if < or equal to LGSIL LEEP if > LGSIL on pap   Meds ordered this encounter  Medications  . Levonorgest-Eth Estrad-Fe Bisg (BALCOLTRA) 0.1-20 MG-MCG(21) TABS    Sig: Take 1 tablet by mouth daily.     Dispense:  28 tablet    Refill:  2     Brock Bad, MD 07/29/2019 12:24 PM

## 2019-07-29 NOTE — Progress Notes (Signed)
Pt is in the office for follow up pap smear after Colpo on 02-19-19. Pt wants to discuss possible IUD removal, placed in 2016, pt states she has been having irregular bleeding.Pt also requests std testing today.

## 2019-07-30 LAB — CERVICOVAGINAL ANCILLARY ONLY
Bacterial Vaginitis (gardnerella): POSITIVE — AB
Candida Glabrata: NEGATIVE
Candida Vaginitis: POSITIVE — AB
Chlamydia: NEGATIVE
Comment: NEGATIVE
Comment: NEGATIVE
Comment: NEGATIVE
Comment: NEGATIVE
Comment: NEGATIVE
Comment: NORMAL
Neisseria Gonorrhea: NEGATIVE
Trichomonas: NEGATIVE

## 2019-07-31 ENCOUNTER — Other Ambulatory Visit: Payer: Self-pay | Admitting: Obstetrics

## 2019-07-31 DIAGNOSIS — B373 Candidiasis of vulva and vagina: Secondary | ICD-10-CM

## 2019-07-31 DIAGNOSIS — N760 Acute vaginitis: Secondary | ICD-10-CM

## 2019-07-31 DIAGNOSIS — B3731 Acute candidiasis of vulva and vagina: Secondary | ICD-10-CM

## 2019-07-31 DIAGNOSIS — B9689 Other specified bacterial agents as the cause of diseases classified elsewhere: Secondary | ICD-10-CM

## 2019-07-31 MED ORDER — FLUCONAZOLE 150 MG PO TABS: ORAL_TABLET | ORAL | 0 refills | Status: DC

## 2019-07-31 MED ORDER — TINIDAZOLE 500 MG PO TABS: 1000.0000 mg | ORAL_TABLET | Freq: Every day | ORAL | 0 refills | Status: DC

## 2019-08-03 LAB — CYTOLOGY - PAP
Comment: NEGATIVE
Comment: NEGATIVE
Comment: NEGATIVE
Diagnosis: HIGH — AB
HPV 16: NEGATIVE
HPV 18 / 45: NEGATIVE
High risk HPV: POSITIVE — AB

## 2019-08-05 ENCOUNTER — Ambulatory Visit (INDEPENDENT_AMBULATORY_CARE_PROVIDER_SITE_OTHER): Payer: BC Managed Care – PPO | Admitting: Obstetrics & Gynecology

## 2019-08-05 ENCOUNTER — Encounter: Payer: Self-pay | Admitting: Obstetrics & Gynecology

## 2019-08-05 ENCOUNTER — Other Ambulatory Visit: Payer: Self-pay

## 2019-08-05 VITALS — BP 117/73 | HR 71 | Wt 205.0 lb

## 2019-08-05 DIAGNOSIS — N939 Abnormal uterine and vaginal bleeding, unspecified: Secondary | ICD-10-CM

## 2019-08-05 DIAGNOSIS — N921 Excessive and frequent menstruation with irregular cycle: Secondary | ICD-10-CM | POA: Diagnosis not present

## 2019-08-05 DIAGNOSIS — Z975 Presence of (intrauterine) contraceptive device: Secondary | ICD-10-CM | POA: Diagnosis not present

## 2019-08-05 NOTE — Progress Notes (Signed)
Consult for LEEP.  07/29/19: +HPV HIGH GRADE (HSIL)  + HPV  Neg HPV 16/18/45  Pt wants to also discuss other birth control options.has paragard.

## 2019-08-05 NOTE — Progress Notes (Signed)
Patient ID: Grace Vazquez, female   DOB: 1986-01-14, 34 y.o.   MRN: [id number]  Chief Complaint  Patient presents with  . Consult    HPI Grace Vazquez is a 34 y.o. female.  [id number] No LMP recorded. (Menstrual status: IUD). Patient had HSIL pap 12/2018 and colposcopy 01/2019 with LSIL. Repeat pap 2 weeks ago showed HSIL. She could have LEEP vs. F/u pap 6 months as she had dx LSIL 5 month ago. She has spotting with her IUD, Paragard HPI  Past Medical History:  Diagnosis Date  . Medical history non-contributory     Past Surgical History:  Procedure Laterality Date  . DILATION AND CURETTAGE OF UTERUS      Family History  Problem Relation Age of Onset  . Breast cancer Maternal Aunt     Social History Social History   Tobacco Use  . Smoking status: Never Smoker  . Smokeless tobacco: Never Used  Substance Use Topics  . Alcohol use: No    Alcohol/week: 0.0 standard drinks  . Drug use: No    No Known Allergies  Current Outpatient Medications  Medication Sig Dispense Refill  . tinidazole (TINDAMAX) 500 MG tablet Take 2 tablets (1,000 mg total) by mouth daily with breakfast. 10 tablet 0  . fluconazole (DIFLUCAN) 150 MG tablet TAKE 1 TABLET BY MOUTH AS ONE DOSE (Patient not taking: Reported on 08/05/2019) 1 tablet 0  . Levonorgest-Eth Estrad-Fe Bisg (BALCOLTRA) 0.1-20 MG-MCG(21) TABS Take 1 tablet by mouth daily. (Patient not taking: Reported on 08/05/2019) 28 tablet 2   No current facility-administered medications for this visit.    Review of Systems Review of Systems  Respiratory: Negative.   Gastrointestinal: Negative.   Genitourinary: Positive for vaginal bleeding. Negative for vaginal discharge.    Blood pressure 117/73, pulse 71, weight 205 lb (93 kg).  Physical Exam Physical Exam Constitutional:      Appearance: Normal appearance.  Pulmonary:     Effort: Pulmonary effort is normal.  Neurological:     Mental Status: She is alert.  Psychiatric:        Mood  and Affect: Mood normal.        Behavior: Behavior normal.     Data Reviewed Pap and Bx results  Assessment Abnormal pap, LSIL Bx 5-6 months ago Spotting with Paragard  Plan She agrees to f/u pap in 6 months due to the recent LSIL Bx, with option to have LEEP. Pelvic US for IUD position    Scheryl Darter 08/05/2019, 5:30 PM

## 2019-08-05 NOTE — Patient Instructions (Signed)
Loop Electrosurgical Excision Procedure Loop electrosurgical excision procedure (LEEP) is the cutting and removal (excision) of tissue from the cervix. The cervix is the bottom part of the uterus that opens into the vagina. The tissue that is removed from the cervix is examined to see if there are precancerous cells or cancer cells present. LEEP may be done when:  You have abnormal bleeding from your cervix.  You have an abnormal Pap test result.  Your health care provider finds an abnormality on your cervix during a pelvic exam. LEEP typically only takes a few minutes and is often done in the health care provider's office. The procedure is safe for women who are trying to get pregnant. However, the procedure is usually not done during a menstrual period or during pregnancy. Tell a health care provider about:  Any allergies you have.  All medicines you are taking, including vitamins, herbs, eye drops, creams, and over-the-counter medicines.  Any blood disorders you have.  Any medical conditions you have, including current or past vaginal infections such as herpes or sexually-transmitted infections (STIs).  Whether you are pregnant or may be pregnant.  Whether or not you are having vaginal bleeding on the day of the procedure. What are the risks? Generally, this is a safe procedure. However, problems may occur, including:  Infection.  Bleeding.  Allergic reactions to medicines.  Changes or scarring in the cervix.  Increased risk of early (preterm) labor in future pregnancies. What happens before the procedure?  Ask your health care provider about: ? Changing or stopping your regular medicines. This is especially important if you are taking diabetes medicines or blood thinners. ? Taking medicines such as aspirin and ibuprofen. These medicines can thin your blood. Do not take these medicines unless your health care provider tells you to take them. ? Taking over-the-counter  medicines, vitamins, herbs, and supplements.  Your health care provider may recommend that you take pain medicine before the procedure.  Ask your health care provider if you should plan to have someone take you home after the procedure. What happens during the procedure?   An instrument called a speculum will be placed in your vagina. This will allow your health care provider to see your cervix.  You will be given a medicine to numb the area (local anesthetic). The medicine will be injected into your cervix and the surrounding area.  A solution will be applied to your cervix. This solution will help the health care provider find the abnormal cells that need to be removed.  A thin wire loop will be passed through your vagina. The wire will be used to burn (cauterize) the cervical tissue with an electrical current.  You may feel faint during the procedure. Tell your health care provider right away if you feel this way.  The abnormal cervical tissue will be removed.  Any open blood vessels will be cauterized to prevent bleeding.  A paste may be applied to the cauterized area of your cervix to help prevent bleeding.  The sample of cervical tissue will be examined under a microscope. The procedure may vary among health care providers and hospitals. What can I expect after the procedure? After the procedure, it is common to have:  Mild abdominal cramps that are similar to menstrual cramps. These may last for up to 1 week.  A small amount of pink-tinged or bloody vaginal discharge, including light to moderate bleeding, for 1-2 weeks.  A dark-colored discharge coming from your vagina. This is from   the paste that was used on the cervix to prevent bleeding. It is up to you to get the results of your procedure. Ask your health care provider, or the department that is doing the procedure, when your results will be ready. Follow these instructions at home:  Take over-the-counter and  prescription medicines only as told by your health care provider.  Return to your normal activities as told by your health care provider. Ask your health care provider what activities are safe for you.  Do not put anything in your vagina for 2 weeks after the procedure or until your health care provider says that it is okay. This includes tampons, creams, and douches.  Do not have sex until your health care provider approves.  Keep all follow-up visits as told by your health care provider. This is important. Contact a health care provider if you:  Have a fever or chills.  Feel unusually weak.  Have vaginal bleeding that is heavier or longer than a normal menstrual cycle. A sign of this can be soaking a pad with blood or bleeding with clots.  Develop a bad smelling vaginal discharge.  Have severe abdominal pain or cramping. Summary  Loop electrosurgical excision procedure (LEEP) is the removal of tissue from the cervix. The removed tissue will be checked for precancerous cells or cancer cells.  LEEP typically only takes a few minutes and is often done in the health care provider's office.  Do not put anything in your vagina for 2 weeks after the procedure or until your health care provider says that it is okay. This includes tampons, creams, and douches.  Keep all follow-up visits as told by your health care provider. Ask your health care provider, or the department that is doing the procedure, when your results will be ready. This information is not intended to replace advice given to you by your health care provider. Make sure you discuss any questions you have with your health care provider. Document Revised: 07/04/2018 Document Reviewed: 07/04/2018 Elsevier Patient Education  2020 Elsevier Inc.  

## 2019-09-02 ENCOUNTER — Ambulatory Visit
Admission: RE | Admit: 2019-09-02 | Discharge: 2019-09-02 | Disposition: A | Discharge status: Home / Self Care | Payer: BC Managed Care – PPO | Source: Ambulatory Visit | Attending: Obstetrics & Gynecology | Admitting: Obstetrics & Gynecology

## 2019-09-02 DIAGNOSIS — N939 Abnormal uterine and vaginal bleeding, unspecified: Secondary | ICD-10-CM

## 2019-10-07 ENCOUNTER — Telehealth: Payer: Self-pay

## 2019-10-07 MED ORDER — METRONIDAZOLE 500 MG PO TABS: 500.0000 mg | ORAL_TABLET | Freq: Two times a day (BID) | ORAL | 0 refills | Status: DC

## 2019-10-07 NOTE — Telephone Encounter (Signed)
Returned call, pt reports discharge and odor, requests bv treatment, sent per protocol

## 2019-10-22 ENCOUNTER — Telehealth (INDEPENDENT_AMBULATORY_CARE_PROVIDER_SITE_OTHER): Payer: BC Managed Care – PPO | Admitting: Obstetrics and Gynecology

## 2019-10-22 ENCOUNTER — Encounter: Payer: Self-pay | Admitting: Obstetrics and Gynecology

## 2019-10-22 DIAGNOSIS — N921 Excessive and frequent menstruation with irregular cycle: Secondary | ICD-10-CM

## 2019-10-22 DIAGNOSIS — Z975 Presence of (intrauterine) contraceptive device: Secondary | ICD-10-CM | POA: Diagnosis not present

## 2019-10-22 DIAGNOSIS — N7011 Chronic salpingitis: Secondary | ICD-10-CM | POA: Insufficient documentation

## 2019-10-22 DIAGNOSIS — N7001 Acute salpingitis: Secondary | ICD-10-CM

## 2019-10-22 MED ORDER — DOXYCYCLINE HYCLATE 100 MG PO CAPS: 100.0000 mg | ORAL_CAPSULE | Freq: Two times a day (BID) | ORAL | 0 refills | Status: AC

## 2019-10-22 NOTE — Progress Notes (Signed)
Patient ID: Grace Vazquez, female   DOB: [birth year], 34 y.o.   MRN: [id number]   TELEHEALTH VIRTUAL GYNECOLOGY VISIT ENCOUNTER NOTE  I connected with Alvina Chou on 10/22/19 at  3:15 PM EDT by telephone at home and verified that I am speaking with the correct person using two identifiers.   I discussed the limitations, risks, security and privacy concerns of performing an evaluation and management service by telephone and the availability of in person appointments. I also discussed with the patient that there may be a patient responsible charge related to this service. The patient expressed understanding and agreed to proceed.   History:  Grace Vazquez is a 34 y.o. G40P1011 female being evaluated today for follow up of break through bleeding with ParaGard IUD. IUD was placed 08/2014. She reports having regular monthly cycles until about 2 yrs ago.  U/S showed IUD in proper position. Hydrosalpinx noted as well. Results reviewed with pt.   Past Medical History:  Diagnosis Date  . Medical history non-contributory    Past Surgical History:  Procedure Laterality Date  . DILATION AND CURETTAGE OF UTERUS     The following portions of the patient's history were reviewed and updated as appropriate: allergies, current medications, past family history, past medical history, past social history, past surgical history and problem list.     Review of Systems:  Pertinent items noted in HPI and remainder of comprehensive ROS otherwise negative.  Physical Exam:   General:  Alert, oriented and cooperative.   Mental Status: Normal mood and affect perceived. Normal judgment and thought content.  Physical exam deferred due to nature of the encounter  Labs and Imaging No results found for this or any previous visit (from the past 336 hour(s)). No results found.    Assessment and Plan:     1. Breakthrough bleeding associated with intrauterine device (IUD) Tx options reviewed with pt. Will treat  with Doxycycline x 14 days and follow up in 3 months  2. Hydrosalpinx  - US PELVIC COMPLETE WITH TRANSVAGINAL; Future       I discussed the assessment and treatment plan with the patient. The patient was provided an opportunity to ask questions and all were answered. The patient agreed with the plan and demonstrated an understanding of the instructions.   The patient was advised to call back or seek an in-person evaluation/go to the ED if the symptoms worsen or if the condition fails to improve as anticipated.  I provided 12 minutes of non-face-to-face time during this encounter.   Hermina Staggers, MD [facility] : [facility], [facility]

## 2019-10-22 NOTE — Progress Notes (Signed)
F/u after pelvic u/s for Paragard IUD check  Pt c/o 2 periods 2 weeks apart.

## 2019-11-02 ENCOUNTER — Other Ambulatory Visit: Payer: BC Managed Care – PPO

## 2019-11-10 ENCOUNTER — Telehealth: Payer: Self-pay

## 2019-11-10 NOTE — Telephone Encounter (Signed)
Returned call and pt stated that she took antibiotics on an empty stomach and threw up. Advised that pt that antibiotics should not be taken on an empty stomach, pt agreed.

## 2019-11-11 ENCOUNTER — Ambulatory Visit
Admission: RE | Admit: 2019-11-11 | Discharge: 2019-11-11 | Disposition: A | Discharge status: Home / Self Care | Payer: BC Managed Care – PPO | Source: Ambulatory Visit | Attending: Obstetrics and Gynecology | Admitting: Obstetrics and Gynecology

## 2019-11-11 DIAGNOSIS — N7011 Chronic salpingitis: Secondary | ICD-10-CM

## 2020-01-14 ENCOUNTER — Ambulatory Visit: Payer: BC Managed Care – PPO | Admitting: Obstetrics and Gynecology

## 2020-02-01 ENCOUNTER — Encounter: Payer: Self-pay | Admitting: *Deleted

## 2020-02-01 ENCOUNTER — Other Ambulatory Visit: Payer: Self-pay

## 2020-02-01 ENCOUNTER — Ambulatory Visit: Payer: BC Managed Care – PPO | Admitting: Obstetrics & Gynecology

## 2020-02-01 ENCOUNTER — Encounter: Payer: Self-pay | Admitting: Obstetrics & Gynecology

## 2020-02-01 VITALS — BP 113/75 | HR 80 | Ht 64.0 in | Wt 205.0 lb

## 2020-02-01 DIAGNOSIS — Z975 Presence of (intrauterine) contraceptive device: Secondary | ICD-10-CM

## 2020-02-01 DIAGNOSIS — N921 Excessive and frequent menstruation with irregular cycle: Secondary | ICD-10-CM

## 2020-02-01 DIAGNOSIS — R877 Abnormal histological findings in specimens from female genital organs: Secondary | ICD-10-CM

## 2020-02-01 DIAGNOSIS — N7011 Chronic salpingitis: Secondary | ICD-10-CM | POA: Diagnosis not present

## 2020-02-01 NOTE — Patient Instructions (Signed)
Contraception Choices Contraception, also called birth control, refers to methods or devices that prevent pregnancy. Hormonal methods Contraceptive implant  A contraceptive implant is a thin, plastic tube that contains a hormone. It is inserted into the upper part of the arm. It can remain in place for up to 3 years. Progestin-only injections Progestin-only injections are injections of progestin, a synthetic form of the hormone progesterone. They are given every 3 months by a health care provider. Birth control pills  Birth control pills are pills that contain hormones that prevent pregnancy. They must be taken once a day, preferably at the same time each day. Birth control patch  The birth control patch contains hormones that prevent pregnancy. It is placed on the skin and must be changed once a week for three weeks and removed on the fourth week. A prescription is needed to use this method of contraception. Vaginal ring  A vaginal ring contains hormones that prevent pregnancy. It is placed in the vagina for three weeks and removed on the fourth week. After that, the process is repeated with a new ring. A prescription is needed to use this method of contraception. Emergency contraceptive Emergency contraceptives prevent pregnancy after unprotected sex. They come in pill form and can be taken up to 5 days after sex. They work best the sooner they are taken after having sex. Most emergency contraceptives are available without a prescription. This method should not be used as your only form of birth control. Barrier methods Female condom  A female condom is a thin sheath that is worn over the penis during sex. Condoms keep sperm from going inside a woman's body. They can be used with a spermicide to increase their effectiveness. They should be disposed after a single use. Female condom  A female condom is a soft, loose-fitting sheath that is put into the vagina before sex. The condom keeps sperm  from going inside a woman's body. They should be disposed after a single use. Diaphragm  A diaphragm is a soft, dome-shaped barrier. It is inserted into the vagina before sex, along with a spermicide. The diaphragm blocks sperm from entering the uterus, and the spermicide kills sperm. A diaphragm should be left in the vagina for 6-8 hours after sex and removed within 24 hours. A diaphragm is prescribed and fitted by a health care provider. A diaphragm should be replaced every 1-2 years, after giving birth, after gaining more than 15 lb (6.8 kg), and after pelvic surgery. Cervical cap  A cervical cap is a round, soft latex or plastic cup that fits over the cervix. It is inserted into the vagina before sex, along with spermicide. It blocks sperm from entering the uterus. The cap should be left in place for 6-8 hours after sex and removed within 48 hours. A cervical cap must be prescribed and fitted by a health care provider. It should be replaced every 2 years. Sponge  A sponge is a soft, circular piece of polyurethane foam with spermicide on it. The sponge helps block sperm from entering the uterus, and the spermicide kills sperm. To use it, you make it wet and then insert it into the vagina. It should be inserted before sex, left in for at least 6 hours after sex, and removed and thrown away within 30 hours. Spermicides Spermicides are chemicals that kill or block sperm from entering the cervix and uterus. They can come as a cream, jelly, suppository, foam, or tablet. A spermicide should be inserted into the   vagina with an applicator at least 10-15 minutes before sex to allow time for it to work. The process must be repeated every time you have sex. Spermicides do not require a prescription. Intrauterine contraception Intrauterine device (IUD) An IUD is a T-shaped device that is put in a woman's uterus. There are two types:  Hormone IUD.This type contains progestin, a synthetic form of the hormone  progesterone. This type can stay in place for 3-5 years.  Copper IUD.This type is wrapped in copper wire. It can stay in place for 10 years.  Permanent methods of contraception Female tubal ligation In this method, a woman's fallopian tubes are sealed, tied, or blocked during surgery to prevent eggs from traveling to the uterus. Hysteroscopic sterilization In this method, a small, flexible insert is placed into each fallopian tube. The inserts cause scar tissue to form in the fallopian tubes and block them, so sperm cannot reach an egg. The procedure takes about 3 months to be effective. Another form of birth control must be used during those 3 months. Female sterilization This is a procedure to tie off the tubes that carry sperm (vasectomy). After the procedure, the man can still ejaculate fluid (semen). Natural planning methods Natural family planning In this method, a couple does not have sex on days when the woman could become pregnant. Calendar method This means keeping track of the length of each menstrual cycle, identifying the days when pregnancy can happen, and not having sex on those days. Ovulation method In this method, a couple avoids sex during ovulation. Symptothermal method This method involves not having sex during ovulation. The woman typically checks for ovulation by watching changes in her temperature and in the consistency of cervical mucus. Post-ovulation method In this method, a couple waits to have sex until after ovulation. Summary  Contraception, also called birth control, means methods or devices that prevent pregnancy.  Hormonal methods of contraception include implants, injections, pills, patches, vaginal rings, and emergency contraceptives.  Barrier methods of contraception can include female condoms, female condoms, diaphragms, cervical caps, sponges, and spermicides.  There are two types of IUDs (intrauterine devices). An IUD can be put in a woman's uterus to  prevent pregnancy for 3-5 years.  Permanent sterilization can be done through a procedure for males, females, or both.  Natural family planning methods involve not having sex on days when the woman could become pregnant. This information is not intended to replace advice given to you by your health care provider. Make sure you discuss any questions you have with your health care provider. Document Revised: 06/13/2017 Document Reviewed: 07/14/2016 Elsevier Patient Education  2020 Elsevier Inc.  

## 2020-02-01 NOTE — Progress Notes (Signed)
GYN presents for IUD removal and [place] results.

## 2020-02-01 NOTE — Progress Notes (Signed)
    GYNECOLOGY OFFICE PROCEDURE NOTE  Grace Vazquez is a 34 y.o. G2P1011 here for Liletta IUD removal. No GYN concerns.  Last pap smear was on 07/2019 and was HSIL, needs repeat. She has irregular bleeding with Paragard for 5 years and requests removal  IUD Removal  Patient identified, informed consent performed, consent signed.  Patient was in the dorsal lithotomy position, normal external genitalia was noted.  A speculum was placed in the patient's vagina, normal discharge was noted, no lesions. The cervix was visualized, no lesions, no abnormal discharge.  The strings of the IUD were grasped and pulled using ring forceps. The IUD was removed in its entirety. atient tolerated the procedure well.    Patient will use abstinence for contraception. RTC for repeat pap    CLINICAL DATA:  Follow-up hydrosalpinx, IUD  EXAM: TRANSABDOMINAL AND TRANSVAGINAL ULTRASOUND OF PELVIS  DOPPLER ULTRASOUND OF OVARIES  TECHNIQUE: Both transabdominal and transvaginal ultrasound examinations of the pelvis were performed. Transabdominal technique was performed for global imaging of the pelvis including uterus, ovaries, adnexal regions, and pelvic cul-de-sac.  It was necessary to proceed with endovaginal exam following the transabdominal exam to visualize the endometrium and adnexa. Color and duplex Doppler ultrasound was utilized to evaluate blood flow to the ovaries.  COMPARISON:  Pelvic ultrasound, 09/02/2019  FINDINGS: Uterus  Measurements: 9.7 x 5.4 x 6.7 cm = volume: 186 mL. No fibroids or other mass visualized.  Endometrium  Thickness: 13 mm. IUD is present in the fundal endometrial cavity. No focal abnormality visualized.  Right ovary  Measurements: 3.1 x 1.7 x 2.9 cm = volume: 8 mL. Multiple small follicles. Normal appearance/no adnexal mass.  Left ovary  Measurements: 2.6 x 1.4 x 2.1 cm = volume: 4 mL. There is a mildly complex tubular structure in the left  adnexa measuring 3.7 x 2.0 x 3.5 cm.  Pulsed Doppler evaluation of both ovaries demonstrates normal low-resistance arterial and venous waveforms.  Other findings  No abnormal free fluid.  IMPRESSION: 1. Mildly complex tubular structure in the left adnexa measuring 3.7 x 2.0 x 3.5 cm, slightly decreased in size compared to prior examination and again favored to represent a hydrosalpinx.  2. IUD is present in the expected location in the fundal endometrial cavity.   Electronically Signed   By: Lauralyn Primes M.D.   On: 11/11/2019 16:31 Adam Phenix, MD 02/01/2020

## 2020-06-16 ENCOUNTER — Telehealth: Payer: Self-pay | Admitting: Obstetrics & Gynecology

## 2020-06-16 MED ORDER — METRONIDAZOLE 500 MG PO TABS: 500.0000 mg | ORAL_TABLET | Freq: Two times a day (BID) | ORAL | 0 refills | Status: DC

## 2020-06-16 NOTE — Telephone Encounter (Signed)
Patient called requesting Rx for BV.  She is a current established patient.  Rx for Flagyl routed to pharmacy per protocol.

## 2020-07-09 ENCOUNTER — Other Ambulatory Visit: Payer: Self-pay

## 2020-07-09 DIAGNOSIS — Z20822 Contact with and (suspected) exposure to covid-19: Secondary | ICD-10-CM

## 2020-07-12 LAB — NOVEL CORONAVIRUS, NAA: SARS-CoV-2, NAA: DETECTED — AB

## 2020-10-10 ENCOUNTER — Telehealth: Payer: Self-pay | Admitting: Obstetrics and Gynecology

## 2020-10-10 MED ORDER — METRONIDAZOLE 500 MG PO TABS: 500.0000 mg | ORAL_TABLET | Freq: Two times a day (BID) | ORAL | 0 refills | Status: DC

## 2020-10-10 NOTE — Telephone Encounter (Signed)
Patient called with symptoms of BV.  She has vaginal discharge with odor.  No other symptoms.  She is requesting treatment.  Rx routed to pharmacy per protocol.

## 2020-11-02 DIAGNOSIS — D500 Iron deficiency anemia secondary to blood loss (chronic): Secondary | ICD-10-CM | POA: Insufficient documentation

## 2021-06-20 IMAGING — US US PELVIS COMPLETE WITH TRANSVAGINAL
1 series · 13 of 25 positions shown · non-contrast
Comparison: None

CLINICAL DATA: Initial evaluation for abnormal uterine bleeding,
spotting with IUD.



[Series 1: us pelvis complete with transvaginal · 0.20mm/px · 13 of 84 slices shown]
[im 1/84]
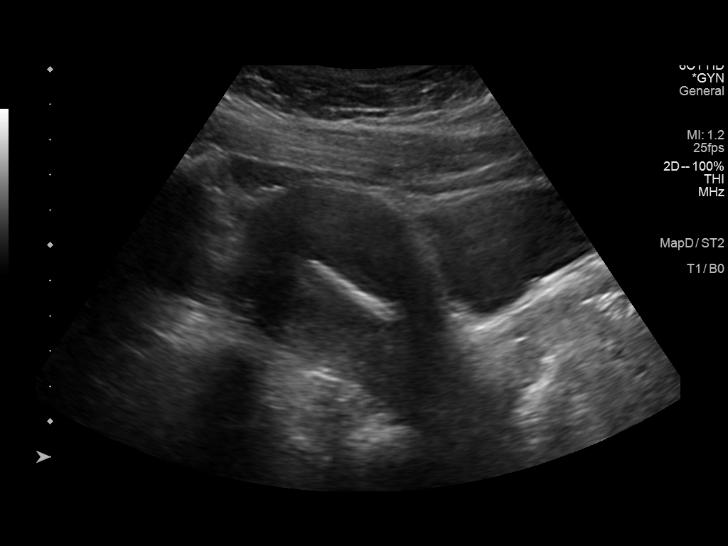
[im 7/84]
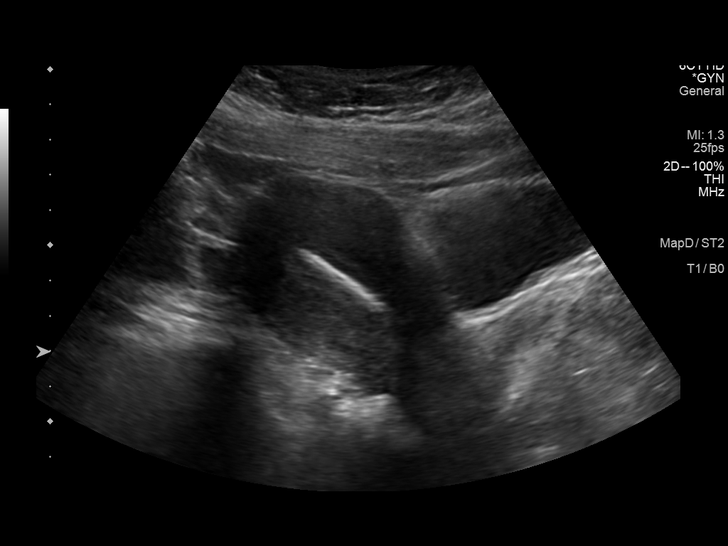
[im 14/84]
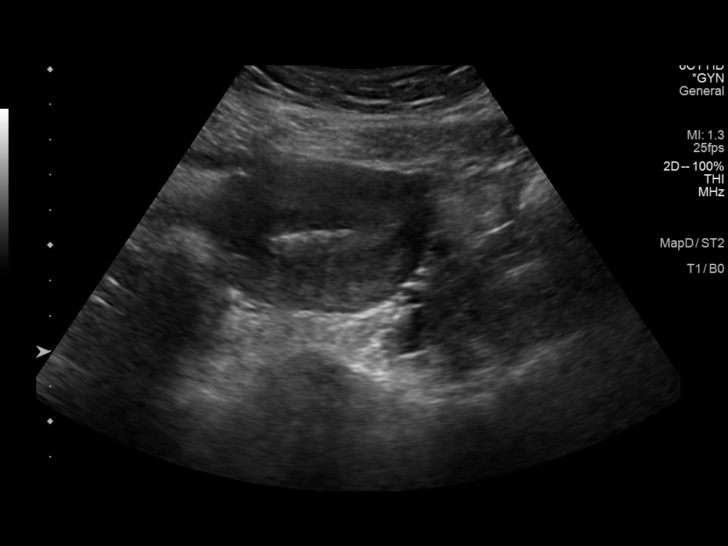
[im 21/84]
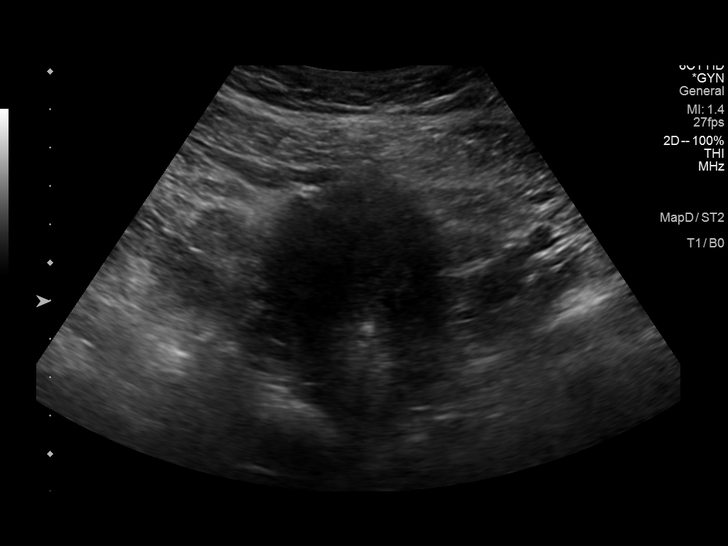
[im 28/84]
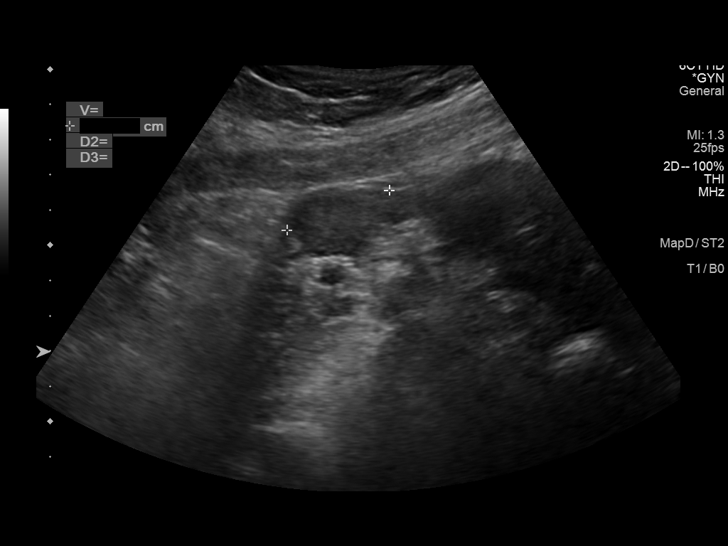
[im 35/84]
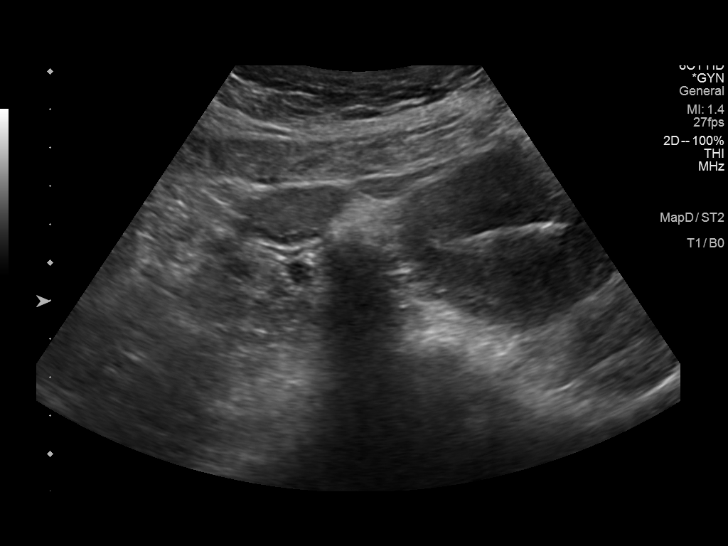
[im 42/84]
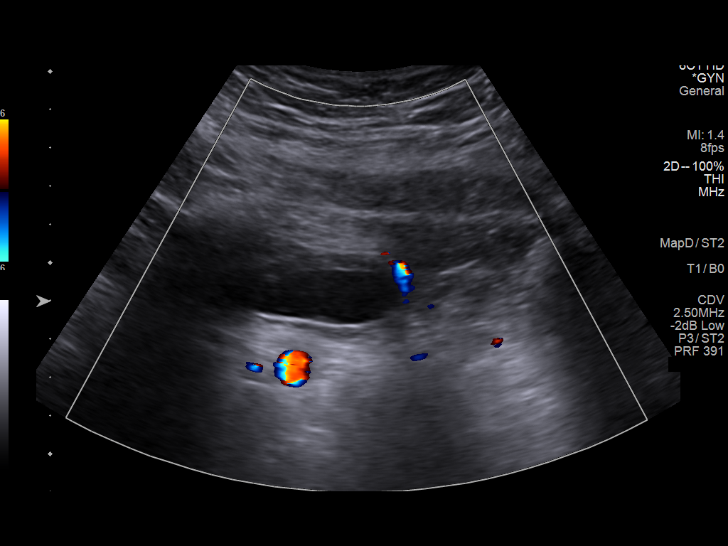
[im 49/84]
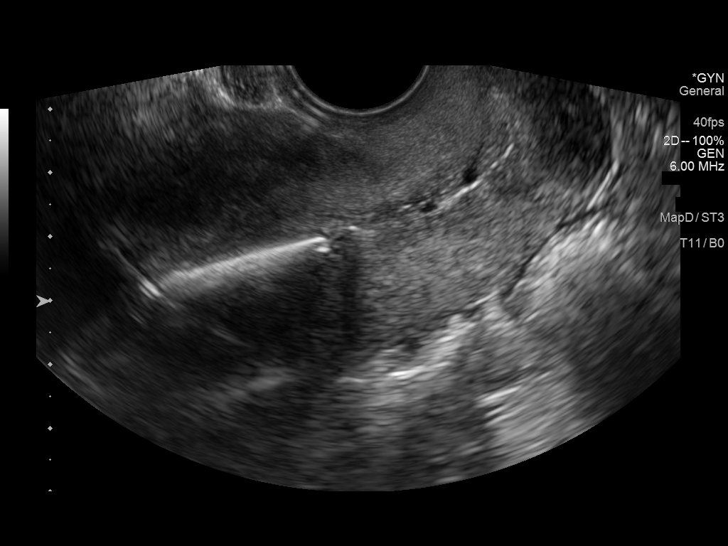
[im 56/84]
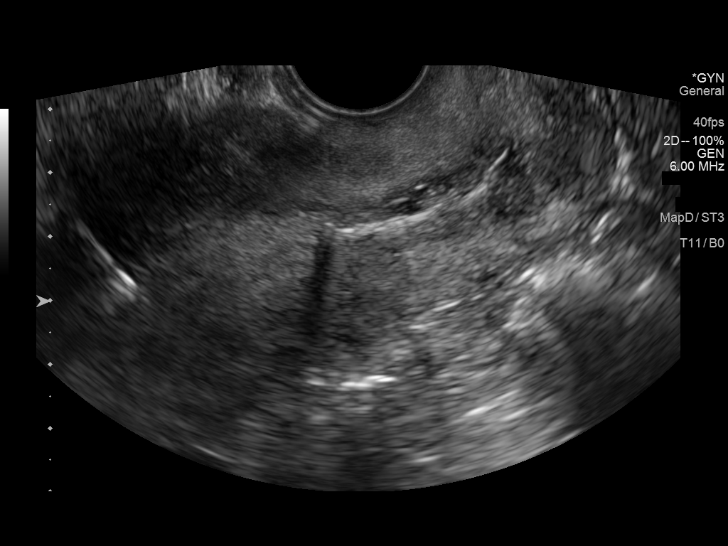
[im 63/84]
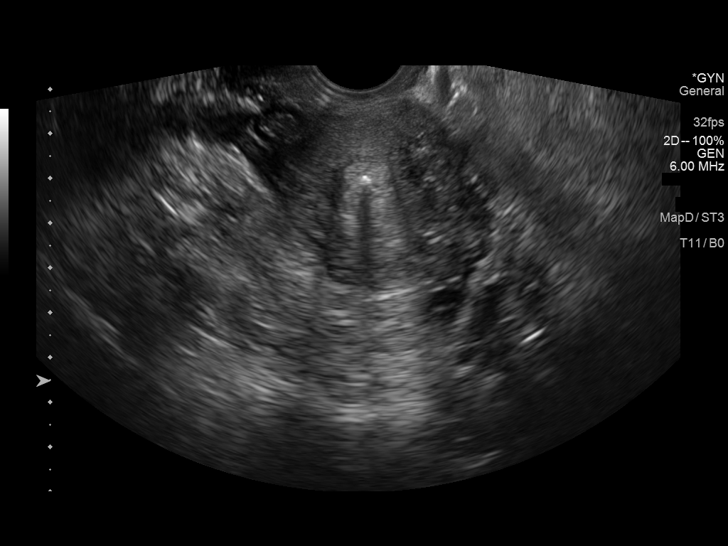
[im 70/84]
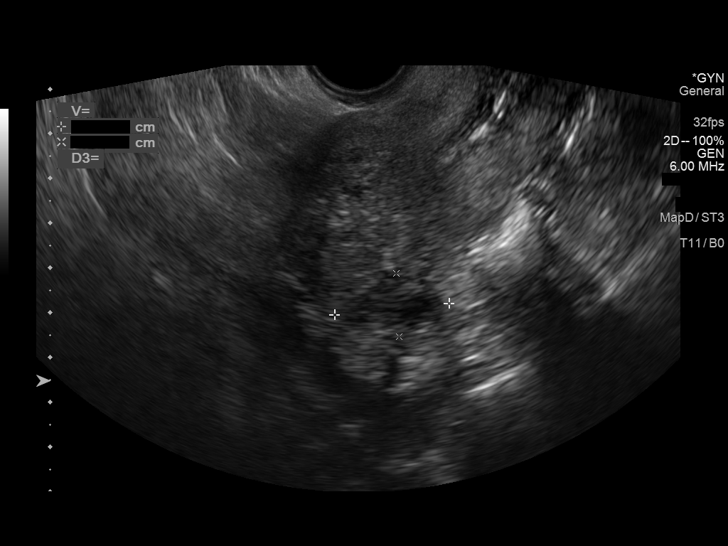
[im 77/84]
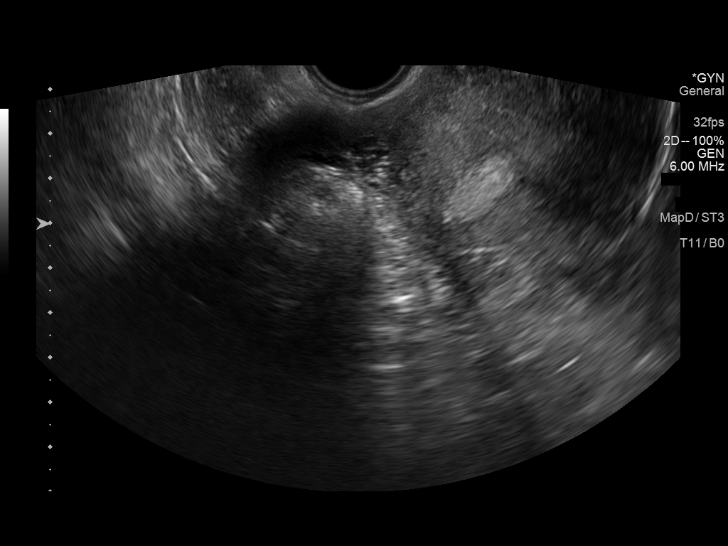
[im 84/84]
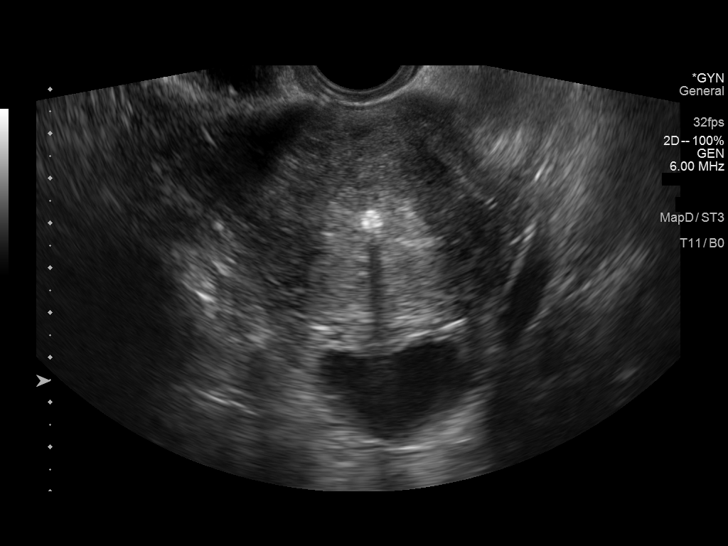

[13 of 25 positions shown; findings below may reference images not displayed]

FINDINGS: Uterus

Measurements: 9.4 x 5.2 x 6.1 cm = volume: 154 mL. No fibroids or
other mass visualized.

Endometrium

Thickness: 12.3 mm. No focal abnormality visualized. IUD in
appropriate position within the endometrial cavity at the level of
the fundus/body.

Right ovary

Measurements: 3.1 x 2.0 x 2.6 cm = volume: 8.8 mL. Normal
appearance/no adnexal mass.

Left ovary

Measurements: 2.9 x 1.7 x 2.2 cm = volume: 5.8 mL. Oblong somewhat
tubular cystic structure measuring 5.3 x 1.8 x 3.1 cm seen within
the left adnexa. Suggestion of internal fold/incomplete septation.
No associated vascularity.

Other findings

No abnormal free fluid.
IMPRESSION: 1. Endometrial stripe measures 12.3 mm in thickness. If bleeding
remains unresponsive to hormonal or medical therapy, sonohysterogram
should be considered for focal lesion work-up. (Ref: Radiological
Reasoning: Algorithmic Workup of Abnormal Vaginal Bleeding with
Endovaginal Sonography and Sonohysterography. AJR [id number]; [id number]).
2. IUD in appropriate position within the endometrial cavity.
3. 5.3 cm tubular cystic structure within the left adnexa, favored
to reflect a hydrosalpinx. Short interval follow-up ultrasound in
6-12 weeks to evaluate for stability and/or resolution suggested.

## 2021-08-29 IMAGING — US US PELVIS COMPLETE WITH TRANSVAGINAL
1 series · 13 of 25 positions shown · non-contrast
Comparison: Pelvic ultrasound, 09/02/2019

CLINICAL DATA: Follow-up hydrosalpinx, IUD

EXAM:
TRANSABDOMINAL AND TRANSVAGINAL ULTRASOUND OF PELVIS
DOPPLER ULTRASOUND OF OVARIES
TECHNIQUE: Both transabdominal and transvaginal ultrasound examinations of the
pelvis were performed. Transabdominal technique was performed for
global imaging of the pelvis including uterus, ovaries, adnexal
regions, and pelvic cul-de-sac.
It was necessary to proceed with endovaginal exam following the
transabdominal exam to visualize the endometrium and adnexa. Color
and duplex Doppler ultrasound was utilized to evaluate blood flow to
the ovaries.

[Series 1: us pelvis complete with transvaginal · 0.20mm/px · 13 of 102 slices shown]
[im 1/102]
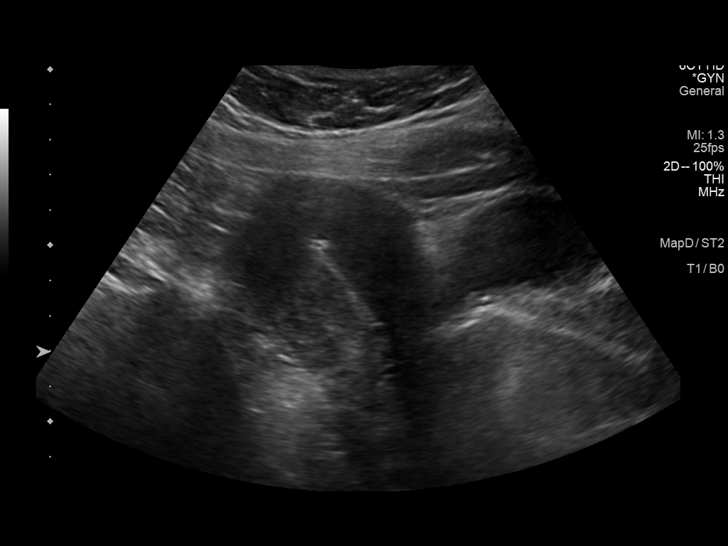
[im 9/102]
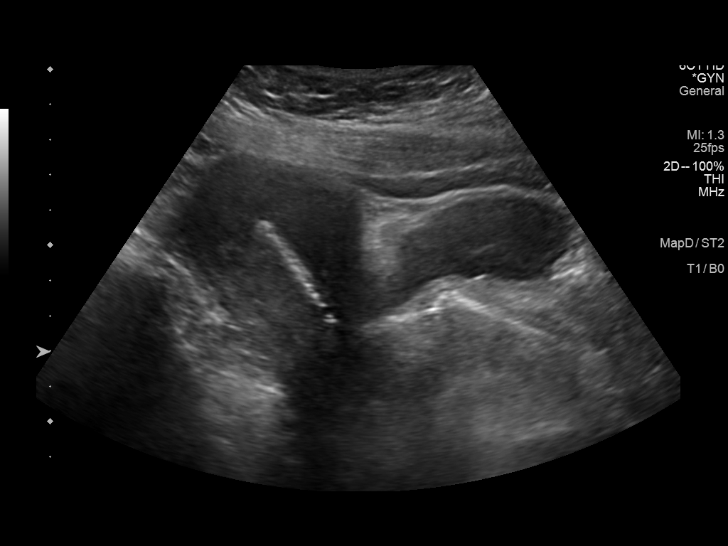
[im 17/102]
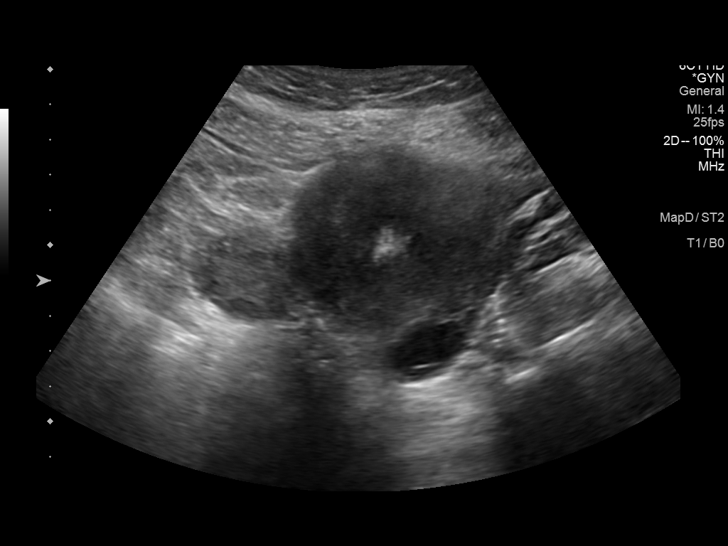
[im 26/102]
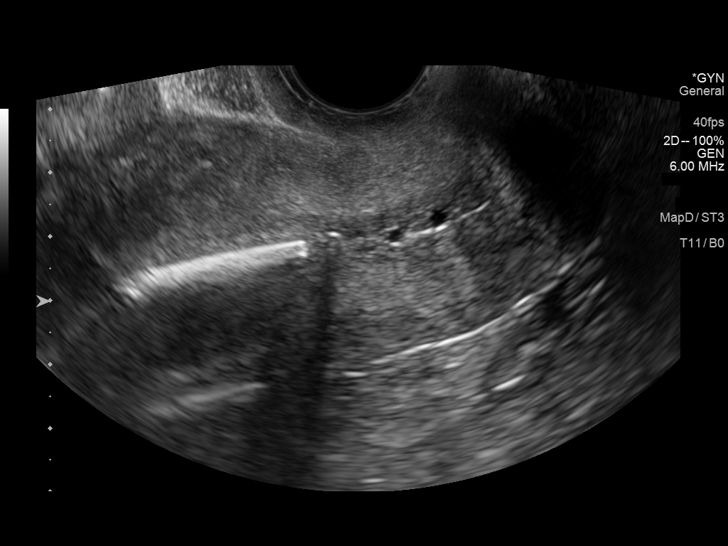
[im 34/102]
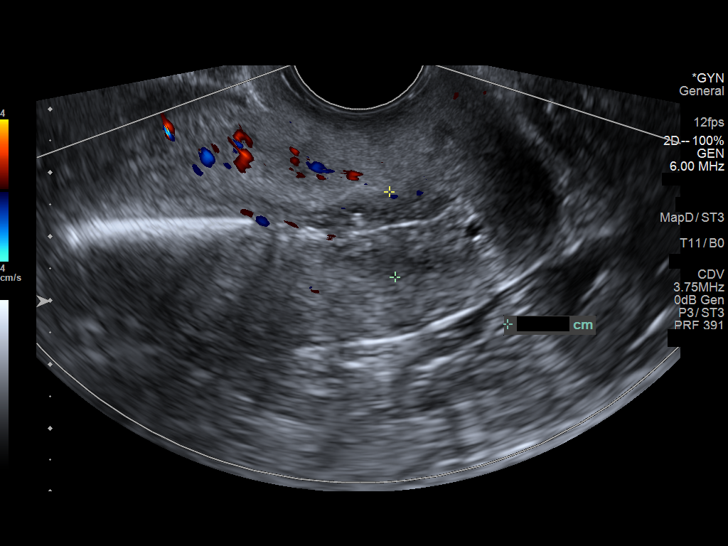
[im 43/102]
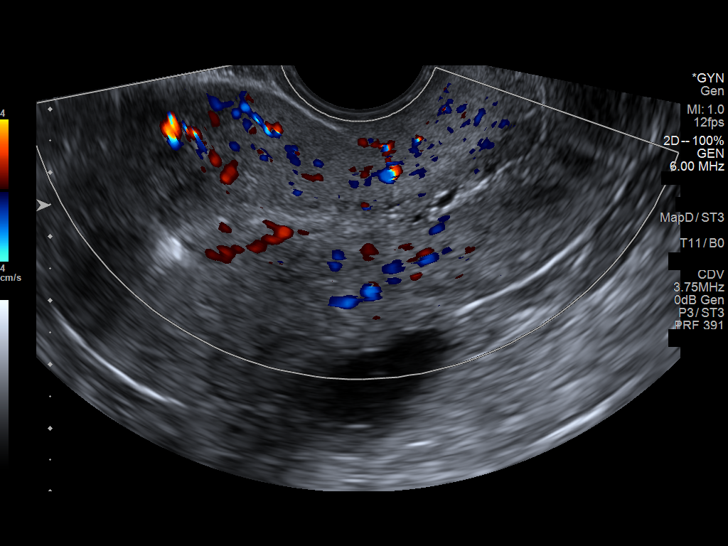
[im 51/102]
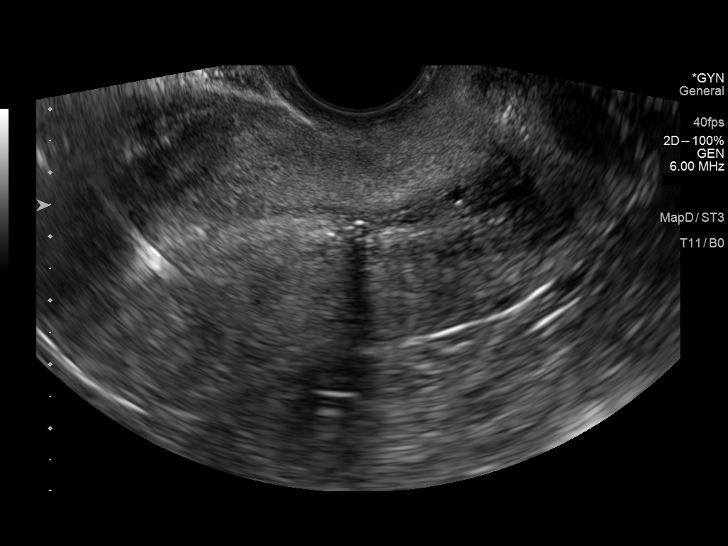
[im 59/102]
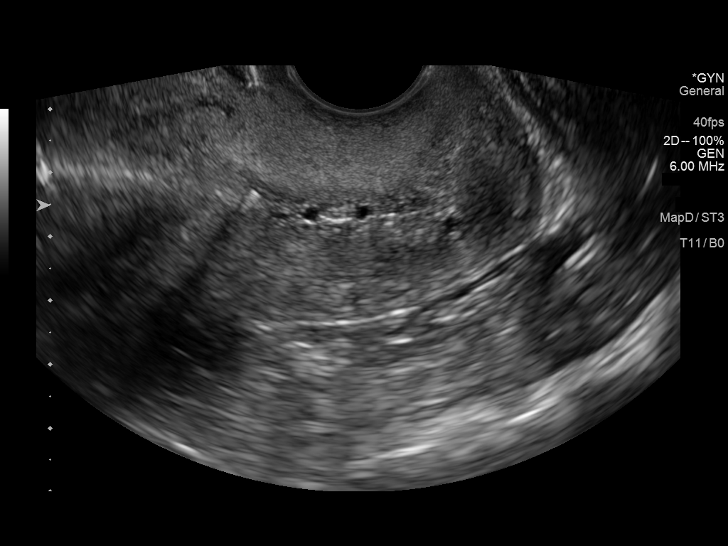
[im 68/102]
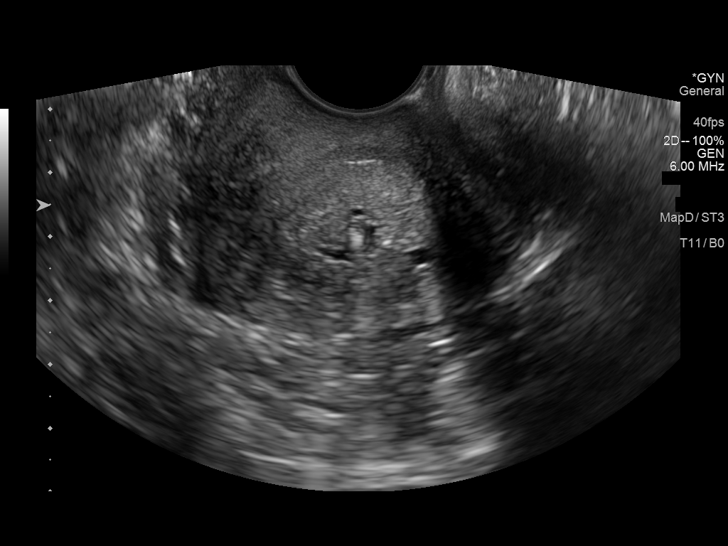
[im 76/102]
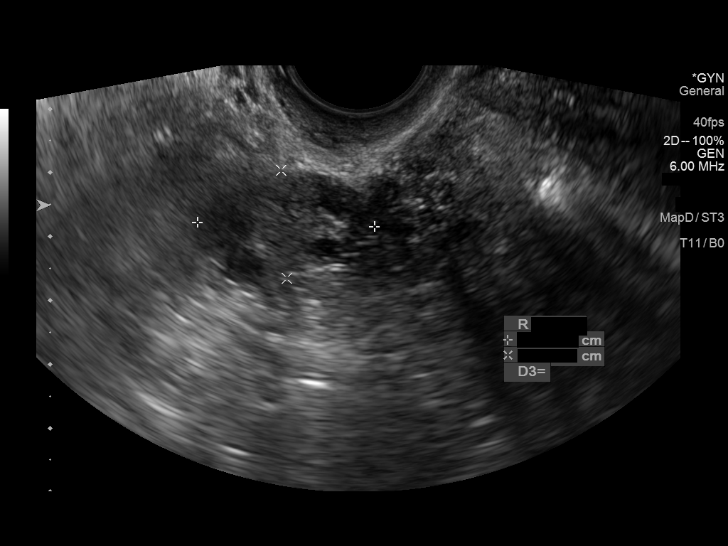
[im 85/102]
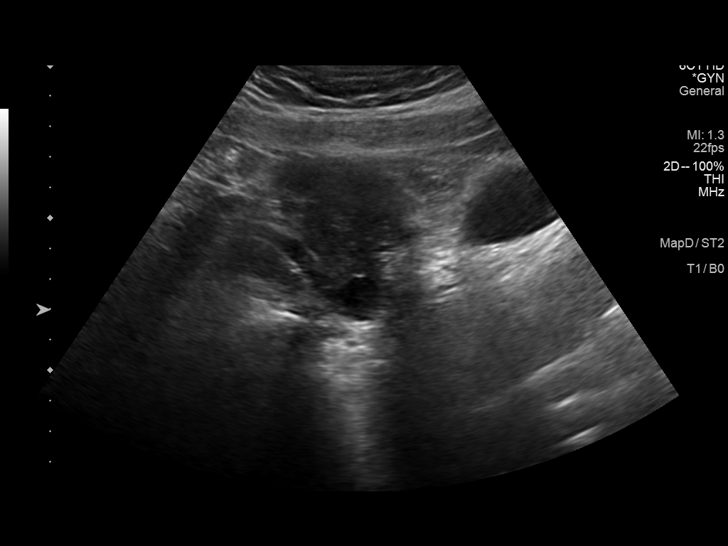
[im 93/102]
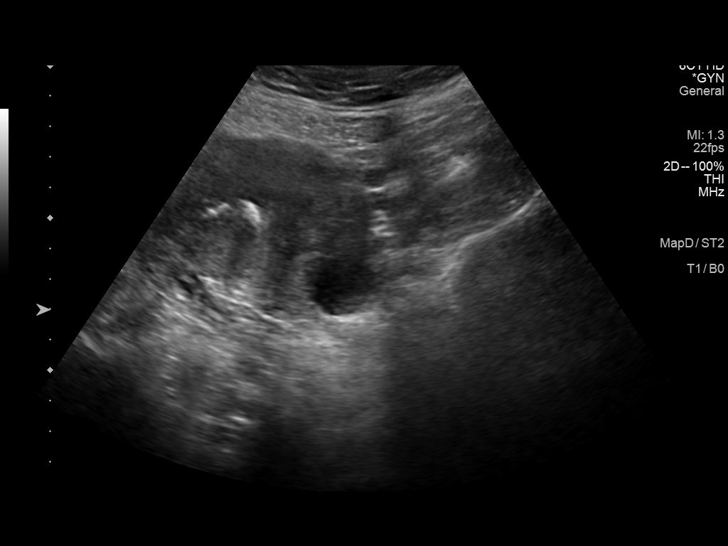
[im 102/102]
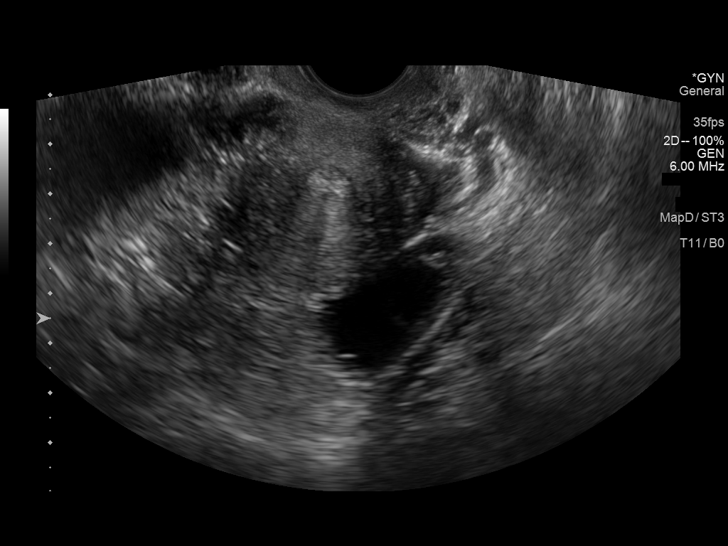

[13 of 25 positions shown; findings below may reference images not displayed]

FINDINGS: Uterus

Measurements: 9.7 x 5.4 x 6.7 cm = volume: 186 mL. No fibroids or
other mass visualized.

Endometrium

Thickness: 13 mm. IUD is present in the fundal endometrial cavity.
No focal abnormality visualized.

Right ovary

Measurements: 3.1 x 1.7 x 2.9 cm = volume: 8 mL. Multiple small
follicles. Normal appearance/no adnexal mass.

Left ovary

Measurements: 2.6 x 1.4 x 2.1 cm = volume: 4 mL. There is a mildly
complex tubular structure in the left adnexa measuring 3.7 x 2.0 x
3.5 cm.

Pulsed Doppler evaluation of both ovaries demonstrates normal
low-resistance arterial and venous waveforms.

Other findings

No abnormal free fluid.
IMPRESSION: 1. Mildly complex tubular structure in the left adnexa measuring
x 2.0 x 3.5 cm, slightly decreased in size compared to prior
examination and again favored to represent a hydrosalpinx.

2. IUD is present in the expected location in the fundal endometrial
cavity.

## 2023-08-08 LAB — LAB REPORT - SCANNED
A1c: 5.6
EGFR: 114
Free T4: 1.01 ng/dL
TSH: 2.08 (ref 0.41–5.90)

## 2023-08-08 LAB — LIPID PANEL
Cholesterol: 209 — AB (ref 0–200)
HDL: 49 (ref 35–70)
LDL Cholesterol: 141
LDl/HDL Ratio: 2.9
Triglycerides: 105 (ref 40–160)

## 2023-08-08 LAB — BASIC METABOLIC PANEL
BUN: 10 (ref 4–21)
CO2: 25 — AB (ref 13–22)
Chloride: 101 (ref 99–108)
Creatinine: 0.7 (ref 0.5–1.1)
Glucose: 90
Potassium: 4.2 meq/L (ref 3.5–5.1)
Sodium: 140 (ref 137–147)

## 2023-08-08 LAB — COMPREHENSIVE METABOLIC PANEL
Albumin: 4.5 (ref 3.5–5.0)
Globulin: 3.2
eGFR: 114

## 2023-08-08 LAB — CBC AND DIFFERENTIAL
HCT: 41 (ref 36–46)
Hemoglobin: 13.1 (ref 12.0–16.0)
Neutrophils Absolute: 6
Platelets: 301 10*3/uL (ref 150–400)
WBC: 9.7

## 2023-08-08 LAB — HEMOGLOBIN A1C: Hemoglobin A1C: 5.6

## 2023-08-08 LAB — HEPATIC FUNCTION PANEL
ALT: 50 U/L — AB (ref 7–35)
AST: 37 — AB (ref 13–35)
Alkaline Phosphatase: 142 — AB (ref 25–125)
Bilirubin, Total: 0.5

## 2023-08-08 LAB — CBC: RBC: 4.88 (ref 3.87–5.11)

## 2023-08-08 LAB — TSH: TSH: 2.08 (ref 0.41–5.90)

## 2023-08-08 LAB — VITAMIN D 25 HYDROXY (VIT D DEFICIENCY, FRACTURES): Vit D, 25-Hydroxy: 18.6

## 2023-08-17 ENCOUNTER — Emergency Department (HOSPITAL_BASED_OUTPATIENT_CLINIC_OR_DEPARTMENT_OTHER): Payer: 59

## 2023-08-17 ENCOUNTER — Other Ambulatory Visit: Payer: Self-pay

## 2023-08-17 ENCOUNTER — Encounter (HOSPITAL_BASED_OUTPATIENT_CLINIC_OR_DEPARTMENT_OTHER): Payer: Self-pay | Admitting: Emergency Medicine

## 2023-08-17 ENCOUNTER — Emergency Department (HOSPITAL_BASED_OUTPATIENT_CLINIC_OR_DEPARTMENT_OTHER)
Admission: EM | Admit: 2023-08-17 | Discharge: 2023-08-17 | Disposition: A | Discharge status: Home / Self Care | Payer: 59 | Attending: Emergency Medicine | Admitting: Emergency Medicine

## 2023-08-17 DIAGNOSIS — M545 Low back pain, unspecified: Secondary | ICD-10-CM | POA: Insufficient documentation

## 2023-08-17 DIAGNOSIS — R051 Acute cough: Secondary | ICD-10-CM | POA: Insufficient documentation

## 2023-08-17 DIAGNOSIS — M549 Dorsalgia, unspecified: Secondary | ICD-10-CM

## 2023-08-17 DIAGNOSIS — R059 Cough, unspecified: Secondary | ICD-10-CM | POA: Diagnosis present

## 2023-08-17 LAB — RESP PANEL BY RT-PCR (RSV, FLU A&B, COVID)  RVPGX2
Influenza A by PCR: NEGATIVE
Influenza B by PCR: NEGATIVE
Resp Syncytial Virus by PCR: NEGATIVE
SARS Coronavirus 2 by RT PCR: NEGATIVE

## 2023-08-17 MED ORDER — BENZONATATE 100 MG PO CAPS: 100.0000 mg | ORAL_CAPSULE | Freq: Three times a day (TID) | ORAL | 0 refills | Status: DC

## 2023-08-17 MED ORDER — ACETAMINOPHEN 325 MG PO TABS
650.0000 mg | ORAL_TABLET | Freq: Once | ORAL | Status: AC
  Administered 2023-08-17: 650 mg via ORAL
  Filled 2023-08-17: qty 2

## 2023-08-17 MED ORDER — IBUPROFEN 400 MG PO TABS
600.0000 mg | ORAL_TABLET | Freq: Once | ORAL | Status: AC
  Administered 2023-08-17: 600 mg via ORAL
  Filled 2023-08-17: qty 1

## 2023-08-17 MED ORDER — BENZONATATE 100 MG PO CAPS
100.0000 mg | ORAL_CAPSULE | Freq: Once | ORAL | Status: AC
  Administered 2023-08-17: 100 mg via ORAL
  Filled 2023-08-17: qty 1

## 2023-08-17 NOTE — ED Triage Notes (Signed)
 Productive Cough x 3 days Sinus pressure, cold symptoms Pain in tail bone when cough  Recent back injury, now some lingering "lower back leg pain"

## 2023-08-17 NOTE — Discharge Instructions (Signed)
 You are seen the emergency department for coughing and back pain related to coughing Your COVID influenza RSV were all negative Your chest x-ray did not show pneumonia She take Tylenol or Motrin as directed for pain and discomfort We have also called in a prescription for Tessalon Perles to your pharmacy to help with the coughing Use this medication as directed until you are feeling better and her cough is resolved Follow-up with your primary care doctor in 1 week for reevaluation Return to the Emergency Department for severe pain trouble breathing or any other concerns

## 2023-08-18 NOTE — ED Provider Notes (Signed)
 [place] EMERGENCY DEPARTMENT AT [facility] Provider Note   CSN: [id number] Arrival date & time: 08/17/23  1901     History  Chief Complaint  Patient presents with   Cough    Grace Vazquez is a 38 y.o. female.  Who presents to the ED for coughing.  Coughing and sinus pressure for the last 3 days.  Recent recovery from viral illness about a week ago.  Pain in her lateral ribs and lower back when coughing.  No fevers chest pain shortness of breath abdominal pain nausea vomiting.  Has been able to walk without issue.  No weakness in her legs, urinary incontinence, bowel incontinence or recent trauma/injury   Cough      Home Medications Prior to Admission medications   Medication Sig Start Date End Date Taking? Authorizing Provider  benzonatate (TESSALON) 100 MG capsule Take 1 capsule (100 mg total) by mouth every 8 (eight) hours. 08/17/23  Yes Royanne Foots, DO  metroNIDAZOLE (FLAGYL) 500 MG tablet Take 1 tablet (500 mg total) by mouth 2 (two) times daily. 06/16/20   Adam Phenix, MD  metroNIDAZOLE (FLAGYL) 500 MG tablet Take 1 tablet (500 mg total) by mouth 2 (two) times daily. 10/10/20   Warden Fillers, MD      Allergies    Patient has no known allergies.    Review of Systems   Review of Systems  Respiratory:  Positive for cough.     Physical Exam Updated Vital Signs BP 124/78 (BP Location: Right Arm)   Pulse 85   Temp 98.5 F (36.9 C) (Oral)   Resp 18   LMP 08/01/2023   SpO2 100%  Physical Exam Vitals and nursing note reviewed.  HENT:     Head: Normocephalic and atraumatic.  Eyes:     Pupils: Pupils are equal, round, and reactive to light.  Cardiovascular:     Rate and Rhythm: Normal rate and regular rhythm.  Pulmonary:     Effort: Pulmonary effort is normal.     Breath sounds: Normal breath sounds.  Abdominal:     Palpations: Abdomen is soft.     Tenderness: There is no abdominal tenderness.  Musculoskeletal:     Cervical back: Neck  supple. No rigidity or tenderness.     Comments: Mild paraspinal lumbar tenderness No midline tenderness step-off deformity back  Skin:    General: Skin is warm and dry.  Neurological:     General: No focal deficit present.     Mental Status: She is alert.     Sensory: No sensory deficit.     Motor: No weakness.  Psychiatric:        Mood and Affect: Mood normal.     ED Results / Procedures / Treatments   Labs (all labs ordered are listed, but only abnormal results are displayed) Labs Reviewed  RESP PANEL BY RT-PCR (RSV, FLU A&B, COVID)  [id number]    EKG None  Radiology DG Chest Portable 1 View Result Date: 08/17/2023 CLINICAL DATA:  Productive cough EXAM: PORTABLE CHEST 1 VIEW COMPARISON:  None Available. FINDINGS: The heart size and mediastinal contours are within normal limits. Both lungs are clear. The visualized skeletal structures are unremarkable. IMPRESSION: No active disease. Electronically Signed   By: Darliss Cheney M.D.   On: 08/17/2023 22:39    Procedures Procedures    Medications Ordered in ED Medications  benzonatate (TESSALON) capsule 100 mg (100 mg Oral Given 08/17/23 2306)  ibuprofen (ADVIL) tablet 600 mg (  600 mg Oral Given 08/17/23 2305)  acetaminophen (TYLENOL) tablet 650 mg (650 mg Oral Given 08/17/23 2305)    ED Course/ Medical Decision Making/ A&P                                 Medical Decision Making 38 year old female here for coughing.  Recent viral syndrome with some congestion but congestion is improving.  No fevers.  Associated pain in her ribs and lower back when coughing.  No red flag symptoms for back pain.  Benign physical exam with clear lung sounds.  Good motor strength throughout her lower extremities and unremarkable examination of the back.  No need for imaging at back at this time as I feel this is most likely related to ongoing coughing. Differential diagnosis includes viral URI pneumonia. No evidence of pneumonia on her chest x-ray.   COVID influenza RSV all negative. Instructed on symptomatic management of cough congestion.  Will prescribe Tessalon Perles to help with her coughing.  Instructed her to take Tylenol Motrin for pain discomfort follow-up with her PCP.  Stable for discharge.  Amount and/or Complexity of Data Reviewed Radiology: ordered.  Risk OTC drugs. Prescription drug management.           Final Clinical Impression(s) / ED Diagnoses Final diagnoses:  Acute cough  Acute back pain, unspecified back location, unspecified back pain laterality    Rx / DC Orders ED Discharge Orders          Ordered    benzonatate (TESSALON) 100 MG capsule  Every 8 hours        08/17/23 2255              Royanne Foots, DO 08/18/23 0014

## 2023-08-21 ENCOUNTER — Ambulatory Visit: Payer: 59

## 2023-08-21 ENCOUNTER — Ambulatory Visit (INDEPENDENT_AMBULATORY_CARE_PROVIDER_SITE_OTHER): Payer: 59 | Admitting: Physician Assistant

## 2023-08-21 ENCOUNTER — Encounter: Payer: Self-pay | Admitting: Physician Assistant

## 2023-08-21 VITALS — BP 130/84 | HR 97 | Temp 98.2°F | Ht 64.0 in | Wt 224.0 lb

## 2023-08-21 DIAGNOSIS — E559 Vitamin D deficiency, unspecified: Secondary | ICD-10-CM

## 2023-08-21 DIAGNOSIS — N926 Irregular menstruation, unspecified: Secondary | ICD-10-CM | POA: Diagnosis not present

## 2023-08-21 DIAGNOSIS — M545 Low back pain, unspecified: Secondary | ICD-10-CM

## 2023-08-21 DIAGNOSIS — R7989 Other specified abnormal findings of blood chemistry: Secondary | ICD-10-CM | POA: Diagnosis not present

## 2023-08-21 LAB — HEPATIC FUNCTION PANEL
ALT: 45 U/L — ABNORMAL HIGH (ref 0–35)
AST: 34 U/L (ref 0–37)
Albumin: 3.9 g/dL (ref 3.5–5.2)
Alkaline Phosphatase: 100 U/L (ref 39–117)
Bilirubin, Direct: 0.1 mg/dL (ref 0.0–0.3)
Total Bilirubin: 0.5 mg/dL (ref 0.2–1.2)
Total Protein: 7.5 g/dL (ref 6.0–8.3)

## 2023-08-21 LAB — POCT URINE PREGNANCY: Preg Test, Ur: NEGATIVE

## 2023-08-21 MED ORDER — VITAMIN D (ERGOCALCIFEROL) 1.25 MG (50000 UNIT) PO CAPS: 50000.0000 [IU] | ORAL_CAPSULE | ORAL | 0 refills | Status: DC

## 2023-08-21 MED ORDER — MELOXICAM 15 MG PO TABS: 15.0000 mg | ORAL_TABLET | Freq: Every day | ORAL | 0 refills | Status: DC | PRN

## 2023-08-21 NOTE — Patient Instructions (Signed)
 It was great to see you!  Recheck liver labs  Xray of your back today Start mobic 15 mg daily as needed Physical therapy referral placed  High dose vitamin D sent  Follow-up with obstetrics-gynecology about your periods  Let's follow-up in 6 months for Comprehensive Physical Exam (CPE) preventive care annual visit, sooner if you have concerns.  Take care,  Jarold Motto PA-C

## 2023-08-21 NOTE — Progress Notes (Signed)
 Grace Vazquez is a 38 y.o. female here for a new problem.  History of Present Illness:   Chief Complaint  Patient presents with   Establish Care   Back Pain    Pt c/o low back pain radiating into buttocks, moved a couch about a month ago and started having pain the following day.  Has not been taking anything regularly. Took Ibuprofen on Saturday at Urgent Care.   Patient is here to establish care today.  Back Pain Patient complains of lower back pain that started after moving a couch about a month ago.  Pain tends to radiate into her buttocks and occasionally to her legs and is mostly located in her lower mid back. Although pain is somewhat elevated by sitting down, she can't seem to stay in one position for long times, often has to shift her position.   She recalls swinging her right leg laterally, caused sharp shooting pain that radiated from her leg to her back. Going from a sitting to standing position seems to cause discomfort but once she's walking she's fine. Denies any tingling, numbness in leg, or urinary symptoms at this time. Can't seem to identify any recent injuries.    She also report being recently sick with a viral sickness which consisted of a cough and nasal drainage. Coughing tends to aggravate her lower back pain.  Only a chest CT was done at that time.  Reports taking ibuprofen this past Saturday when visiting the UC for the viral sickness. Denies taking any other medication to help relief symptoms.      Abnormal Menstrual cycles  She reports experiencing an extremely heavy cycle that lasted for a month. Bleeding was so heavy she had to wear a very thick pad. Bleeding had stopped since then but she states her cycle this month is also longer than usual.  Heavy bleeding was believed to be caused by Covid which she had in January 2022. She did remover her IUD this past December.   Iron levels dropped to 6 around that time. She had to under go blood transfusion and 2  rounds of iron infusions. She was prescribed iron supplements but she has not been taken them regularly.    She recently started taking Pre-natal supplements as she is actively trying to get pregnant.  Denies any abdominal pain or UTI symptoms. She does however endorse frequent bacterial vaginosis infections.   She is following up with obstetrics-gynecology next week -- sees [facility]   Vitamin D deficiency Last vitamin D level was 18. She denies taking vitamin D supplements at this time.    Elevated LFTs Found on last lab draw two weeks ago Alk Phos is 142 ALT is 50   Past Medical History:  Diagnosis Date   Anemia    Iron infusion 2022   Blood transfusion without reported diagnosis    Medical history non-contributory      Social History   Tobacco Use   Smoking status: Never   Smokeless tobacco: Never  Vaping Use   Vaping status: Never Used  Substance Use Topics   Alcohol use: Not Currently   Drug use: Never    Past Surgical History:  Procedure Laterality Date   DILATION AND CURETTAGE OF UTERUS      Family History  Problem Relation Age of Onset   Breast cancer Maternal Aunt     No Known Allergies  Current Medications:   Current Outpatient Medications:    cholecalciferol (VITAMIN D3) 25 MCG (  1000 UNIT) tablet, Take 1,000 Units by mouth daily., Disp: , Rfl:    Iron, Ferrous Sulfate, 325 (65 Fe) MG TABS, Take 1 tablet by mouth daily in the afternoon., Disp: , Rfl:    meloxicam (MOBIC) 15 MG tablet, Take 1 tablet (15 mg total) by mouth daily as needed for pain., Disp: 30 tablet, Rfl: 0   Prenatal Vit-Fe Fumarate-FA (PRENATAL/FOLIC ACID PO), Take 2 each by mouth daily in the afternoon., Disp: , Rfl:    Vitamin D, Ergocalciferol, (DRISDOL) 1.25 MG (50000 UNIT) CAPS capsule, Take 1 capsule (50,000 Units total) by mouth every 7 (seven) days., Disp: 12 capsule, Rfl: 0   Review of Systems:   Review of Systems  Respiratory:  Positive for cough.    Musculoskeletal:  Positive for back pain.  Negative unless otherwise specified per HPI.  Vitals:   Vitals:   08/21/23 1300  BP: 130/84  Pulse: 97  Temp: 98.2 F (36.8 C)  TempSrc: Temporal  Weight: 224 lb (101.6 kg)  Height: 5\' 4"  (1.626 m)     Body mass index is 38.45 kg/m.  Physical Exam:   Physical Exam Vitals and nursing note reviewed.  Constitutional:      General: She is not in acute distress.    Appearance: She is well-developed. She is not ill-appearing or toxic-appearing.  Cardiovascular:     Rate and Rhythm: Normal rate and regular rhythm.     Pulses: Normal pulses.     Heart sounds: Normal heart sounds, S1 normal and S2 normal.  Pulmonary:     Effort: Pulmonary effort is normal.     Breath sounds: Normal breath sounds.  Musculoskeletal:     Comments: Decreased ROM 2/2 pain with flexion/extension. Reproducible tenderness with deep palpation to bilateral paraspinal muscles and lumbar spine. No evidence of erythema, rash or ecchymosis.    Skin:    General: Skin is warm and dry.  Neurological:     Mental Status: She is alert.     GCS: GCS eye subscore is 4. GCS verbal subscore is 5. GCS motor subscore is 6.  Psychiatric:        Speech: Speech normal.        Behavior: Behavior normal. Behavior is cooperative.    Results for orders placed or performed in visit on 08/21/23  POCT urine pregnancy  Result Value Ref Range   Preg Test, Ur Negative Negative    Assessment and Plan:   Acute midline low back pain without sciatica No red flags Consider referral to sports medicine if no improvement Suspect lumbar strain however will get imaging given ongoing symptom(s) Trial mobic 15 mg daily and lidocaine patches Physical therapy referral placed Urine pregnancy test negative   Vitamin D deficiency High dose Vitamin D sent  Elevated LFTs Recheck today  Abnormal menstrual periods Urine pregnancy test is negative Has close follow up appointment with  gynecology next week  Jarold Motto, PA-C  I,Safa M Kadhim,acting as a scribe for [employer], PA.,have documented all relevant documentation on the behalf of Jarold Motto, PA,as directed by  Jarold Motto, PA while in the presence of Jarold Motto, [place].   I, Jarold Motto, [place], have reviewed all documentation for this visit. The documentation on 08/21/23 for the exam, diagnosis, procedures, and orders are all accurate and complete.

## 2023-08-22 ENCOUNTER — Encounter: Payer: Self-pay | Admitting: Physician Assistant

## 2023-08-23 ENCOUNTER — Encounter: Payer: Self-pay | Admitting: Physician Assistant

## 2023-08-23 LAB — THYROXINE (T4) FREE, DIRECT: T4,Free (Direct): 1.01

## 2023-09-09 ENCOUNTER — Encounter: Payer: Self-pay | Admitting: Physician Assistant

## 2023-09-17 ENCOUNTER — Other Ambulatory Visit: Payer: Self-pay | Admitting: Physician Assistant

## 2023-09-17 NOTE — Therapy (Unsigned)
 OUTPATIENT PHYSICAL THERAPY THORACOLUMBAR EVALUATION   Patient Name: Grace Vazquez MRN: [id number] DOB:06-Dec-1985, 38 y.o., female Today's Date: 09/18/2023  END OF SESSION:  PT End of Session - 09/18/23 0845     Visit Number 1    Number of Visits 16    Date for PT Re-Evaluation 12/11/23    Authorization Type [employer]    PT Start Time [phone]    PT Stop Time 0929    PT Time Calculation (min) 43 min    Activity Tolerance Patient tolerated treatment well    Behavior During Therapy [facility] for tasks assessed/performed             Past Medical History:  Diagnosis Date   Anemia    Iron infusion 2022   Blood transfusion without reported diagnosis    given after heavy menses thought to be from COVID   Past Surgical History:  Procedure Laterality Date   DILATION AND CURETTAGE OF UTERUS     Patient Active Problem List   Diagnosis Date Noted   Class 2 severe obesity due to excess calories with serious comorbidity and body mass index (BMI) of 35.0 to 35.9 in adult (HCC) 01/26/2021   Iron deficiency anemia due to chronic blood loss 11/02/2020   Hydrosalpinx 10/22/2019   Breakthrough bleeding associated with intrauterine device (IUD) 08/05/2019   HGSIL on cytologic smear of cervix 02/19/2019    PCP: Jarold Motto, PA  REFERRING PROVIDER: Jarold Motto, PA  REFERRING DIAG: M54.50 (ICD-10-CM) - Acute midline low back pain without sciatica  Rationale for Evaluation and Treatment: Rehabilitation  THERAPY DIAG:  Other low back pain  Muscle weakness (generalized)  ONSET DATE: 1-2 months ago  SUBJECTIVE:                                                                                                                                                                                           SUBJECTIVE STATEMENT: States that she started having back pain in her low back after lifting a couch. States that she thought her pain would go away. States it didn't go away. In the AM it is  the worse depending on how she sleeps. States she thinks sleeps in fetus position and this makes her pain worse. She also has difficulties getting up after sitting for a bit, especially after sitting in a low car.  Pain is  in the mid lower back and close to the tailbone. Hurts to bend over at times or when she moves  a certain way. States that she was brushing her teeth bending over and she had a shock of pain in her back and it  was a little alarming,   States she does a lot of walking for exercise and trying to get back to healthy lifestyle.  States she also bar tends and sometimes she has ankle pain and her ankles swell. Starting with the elliptical with the occasional stretch class in regards to her current activity report.      PERTINENT HISTORY:  NA  PAIN:  Are you having pain? Yes: NPRS scale: 1/10 at worst 9/10 Pain location: right low back into tailbone Pain description: tight, sharp Aggravating factors: AM, bending, sitting in car Relieving factors: medication, heat patches, sleeping on couch in firmer position - laying flat feels better  PRECAUTIONS: None  RED FLAGS: None   WEIGHT BEARING RESTRICTIONS: No  FALLS:  Has patient fallen in last 6 months? 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  DIAGNOSTIC FINDINGS:  lumbar xray 08/21/23 FINDINGS: There are 5 nonrib-bearing lumbar vertebrae.   Anatomic lumbar curvature.   No spondylolysis or spondylolisthesis.   Vertebral body heights are maintained.   No aggressive osseous lesion.   Intervertebral disc heights are maintained. No significant facet arthropathy or marginal osteophyte formation.   Sacroiliac joints are symmetric.   Visualized soft tissues are within normal limits.   IMPRESSION: No acute osseous  abnormality of the lumbar spine.  PATIENT SURVEYS:  Patient-specific activity functional scoring scheme (Point to one number):  "0" represents "unable to perform." "10" represents "able to perform at prior level. 0 1 2 3 4 5 6 7 8 9  10 (Date and Score) Activity Initial  Activity Eval     Getting up from low car/position 3     Getting out of bed in AM  1    Bending  1    Additional Additional Total score = sum of the activity scores/number of activities Minimum detectable change (90%CI) for average score = 2 points Minimum detectable change (90%CI) for single activity score = 3 points PSFS developed by: Jake Seats., & Binkley, J. (1995). Assessing disability and change on individual  patients: a report of a patient specific measure. Physiotherapy [place], [phone], [phone]. Reproduced with the permission of the authors  Score: 5/3= 2.66   COGNITION: Overall cognitive status: Within functional limits for tasks assessed     SENSATION: Not tested     POSTURE: rounded shoulders and decreased lumbar lordosis  PALPATION: Tenderness to palpation along B lumbar paraspinals and right glutes - increased resting tone on right side compared to left side, pain with spring testing in lumbar spine but no significant restrictions noted  LUMBAR ROM:   AROM eval  Flexion 50% limited *  Extension 50% limited *  Right lateral flexion 25% limited   Left lateral flexion 25% limited   Right rotation   Left rotation    (Blank rows = not tested)  *= pain in low back    LE Measurements Lower Extremity Right EVAL Left EVAL   A/PROM MMT A/PROM MMT  Hip Flexion  3+  4  Hip Extension 5 3+* 5 4-  Hip Abduction      Hip Adduction      Hip Internal rotation 50  45   Hip External rotation 40  45   Knee Flexion  4  4  Knee Extension  4+  4+  Ankle Dorsiflexion  4-  4+  Ankle Plantarflexion      Ankle Inversion      Ankle Eversion       (Blank rows = not tested) *  pain   LUMBAR SPECIAL TESTS:  left slump  +, elys' + B, Repeated prone extension    TREATMENT DATE:                                                                                                                               09/18/2023  Therapeutic Exercise:  Educated on anatomy and rationale behind exercises - frequency/duration of exercises and when/how to perform for current pain presentation, on how to access and use medbridge Aerobic: Supine: Prone:  prone lying with 2 pillows--> 1 pillow --> flat then POE then prone extension - 12 minutes  Seated: with lumbar support in different chairs/position - 4 minutes  Standing: lumbar extension x10 5" holds Neuromuscular Re-education: Manual Therapy: Therapeutic Activity: Self Care: Trigger Point Dry Needling:  Modalities:     PATIENT EDUCATION:  Education details: on current presentation, on HEP, on clinical outcomes score and POC, educated on not holding breath with transitional movements  Person educated: Patient Education method: Programmer, multimedia, Demonstration, and Handouts Education comprehension: verbalized understanding   HOME EXERCISE PROGRAM: [id number]  ASSESSMENT:  CLINICAL IMPRESSION: Patient presents to PT with acute low back pain that presented a couple months ago after lifting a couch. Pain symptoms consistent with mechanical low back pain and patient responded well to extension biased program on this date. Patient presents with limitations in ROM, strength and posture that are likely contributing to current presentation and would greatly benefit from skilled PT to improve overall function and QOL.   OBJECTIVE IMPAIRMENTS: decreased activity tolerance, decreased ROM, decreased strength, improper body mechanics, postural dysfunction, and pain.   ACTIVITY LIMITATIONS: lifting, bending, standing, squatting, and sleeping  PARTICIPATION LIMITATIONS: driving and occupation  PERSONAL FACTORS: Fitness are also affecting  patient's functional outcome.   REHAB POTENTIAL: Excellent  CLINICAL DECISION MAKING: Stable/uncomplicated  EVALUATION COMPLEXITY: Low   GOALS: Goals reviewed with patient? yes  SHORT TERM GOALS: Target date: 10/30/2023  Patient will be independent in self management strategies to improve quality of life and functional outcomes. Baseline: New Program Goal status: INITIAL  2.  Patient will report at least 50% improvement in overall symptoms and/or function to demonstrate improved functional mobility Baseline: 0% better Goal status: INITIAL  3.  Patient will demonstrate painfree lumbar ROM. Baseline: painful Goal status: INITIAL       LONG TERM GOALS: Target date: 12/11/2023    Patient will report at least 75% improvement in overall symptoms and/or function to demonstrate improved functional mobility Baseline: 0% better Goal status: INITIAL  2.  Patient will score at least 2 points higher on PSFS average to demonstrate change in overall function. Baseline: see above Goal status: INITIAL  3.  Patient will demonstrate good  lifting mechanics to reduce risk of injury Baseline: not currently Goal status: INITIAL  4.  Patient will report using lumbar support when sitting to reduce stress on low back.  Baseline: not currently Goal status: INITIAL   PLAN:  PT FREQUENCY: 1-2x/week for a total of 16 visits over 12 week certification period  PT DURATION: 12 weeks  PLANNED INTERVENTIONS: 97110-Therapeutic exercises, 97530- Therapeutic activity, 97112- Neuromuscular re-education, [phone]- Self Care, [id number]- Manual therapy, [phone]- Gait training, [phone]- Orthotic Fit/training, [phone]- Canalith repositioning, [id number]- Aquatic Therapy, [id number]- Electrical stimulation (unattended), [phone]- Ionotophoresis 4mg /ml Dexamethasone, Patient/Family education, Balance training, Stair training, Taping, Dry Needling, Joint mobilization, Joint manipulation, Spinal manipulation, Spinal mobilization,  Cryotherapy, and Moist heat   PLAN FOR NEXT SESSION: measure to compression socks with outlet store info, focus on extension biased program, add in breathing exercises/core activation and hip hinge/proper lifting mechanics when able    9:42 AM, 09/18/23 Tereasa Coop, DPT Physical Therapy with [facility]

## 2023-09-18 ENCOUNTER — Encounter: Payer: Self-pay | Admitting: Physical Therapy

## 2023-09-18 ENCOUNTER — Ambulatory Visit: Admitting: Physical Therapy

## 2023-09-18 DIAGNOSIS — M6281 Muscle weakness (generalized): Secondary | ICD-10-CM

## 2023-09-18 DIAGNOSIS — M5459 Other low back pain: Secondary | ICD-10-CM | POA: Diagnosis not present

## 2023-10-01 ENCOUNTER — Encounter: Admitting: Physical Therapy

## 2023-10-15 ENCOUNTER — Encounter: Admitting: Physical Therapy

## 2023-10-15 NOTE — Therapy (Deleted)
 OUTPATIENT PHYSICAL THERAPY THORACOLUMBAR TREATMENT   Patient Name: Grace Vazquez MRN: [id number] DOB:02/14/1986, 38 y.o., female Today's Date: 10/15/2023  END OF SESSION:    Past Medical History:  Diagnosis Date   Anemia    Iron infusion 2022   Blood transfusion without reported diagnosis    given after heavy menses thought to be from COVID   Past Surgical History:  Procedure Laterality Date   DILATION AND CURETTAGE OF UTERUS     Patient Active Problem List   Diagnosis Date Noted   Class 2 severe obesity due to excess calories with serious comorbidity and body mass index (BMI) of 35.0 to 35.9 in adult [facility]) 01/26/2021   Iron deficiency anemia due to chronic blood loss 11/02/2020   Hydrosalpinx 10/22/2019   Breakthrough bleeding associated with intrauterine device (IUD) 08/05/2019   HGSIL on cytologic smear of cervix 02/19/2019    PCP: Alexander Iba, PA  REFERRING PROVIDER: Alexander Iba, PA  REFERRING DIAG: M54.50 (ICD-10-CM) - Acute midline low back pain without sciatica  Rationale for Evaluation and Treatment: Rehabilitation  THERAPY DIAG:  No diagnosis found.  ONSET DATE: 1-2 months ago  SUBJECTIVE:                                                                                                                                                                                           SUBJECTIVE STATEMENT: 10/15/2023  EVAL:States that she started having back pain in her low back after lifting a couch. States that she thought her pain would go away. States it didn't go away. In the AM it is the worse depending on how she sleeps. States she thinks sleeps in fetus position and this makes her pain worse. She also has difficulties getting up after sitting for a bit, especially after sitting in a low car.  Pain is  in the mid lower back and close to the tailbone. Hurts to bend over at times or when she moves  a certain way. States that she was brushing her teeth  bending over and she had a shock of pain in her back and it was a little alarming,   States she does a lot of walking for exercise and trying to get back to healthy lifestyle.  States she also bar tends and sometimes she has ankle pain and her ankles swell. Starting with the elliptical with the occasional stretch class in regards to her current activity report.      PERTINENT HISTORY:  NA  PAIN:  Are you having pain? Yes: NPRS scale: 1/10 at worst 9/10 Pain location: right low back into tailbone Pain description: tight, sharp Aggravating  factors: AM, bending, sitting in car Relieving factors: medication, heat patches, sleeping on couch in firmer position - laying flat feels better  PRECAUTIONS: None  RED FLAGS: None   WEIGHT BEARING RESTRICTIONS: No  FALLS:  Has patient fallen in last 6 months? 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  DIAGNOSTIC FINDINGS:  lumbar xray 08/21/23 FINDINGS: There are 5 nonrib-bearing lumbar vertebrae.   Anatomic lumbar curvature.   No spondylolysis or spondylolisthesis.   Vertebral body heights are maintained.   No aggressive osseous lesion.   Intervertebral disc heights are maintained. No significant facet arthropathy or marginal osteophyte formation.   Sacroiliac joints are symmetric.   Visualized soft tissues are within normal limits.   IMPRESSION: No acute osseous abnormality of the lumbar spine.  PATIENT SURVEYS:  Patient-specific activity functional scoring scheme (Point to one number):  "0" represents "unable to perform." "10" represents "able to perform at prior level. 0 1 2 3 4 5 6 7 8 9  10 (Date and Score) Activity Initial  Activity Eval     Getting up from low car/position 3     Getting out of bed in AM  1    Bending  1     Additional Additional Total score = sum of the activity scores/number of activities Minimum detectable change (90%CI) for average score = 2 points Minimum detectable change (90%CI) for single activity score = 3 points PSFS developed by: [place] Spitz., & Binkley, J. (1995). Assessing disability and change on individual  patients: a report of a patient specific measure. Physiotherapy [place], 47, [phone]. Reproduced with the permission of the authors  Score: 5/3= 2.66   COGNITION: Overall cognitive status: Within functional limits for tasks assessed     SENSATION: Not tested     POSTURE: rounded shoulders and decreased lumbar lordosis  PALPATION: Tenderness to palpation along B lumbar paraspinals and right glutes - increased resting tone on right side compared to left side, pain with spring testing in lumbar spine but no significant restrictions noted  LUMBAR ROM:   AROM eval  Flexion 50% limited *  Extension 50% limited *  Right lateral flexion 25% limited   Left lateral flexion 25% limited   Right rotation   Left rotation    (Blank rows = not tested)  *= pain in low back    LE Measurements Lower Extremity Right EVAL Left EVAL   A/PROM MMT A/PROM MMT  Hip Flexion  3+  4  Hip Extension 5 3+* 5 4-  Hip Abduction      Hip Adduction      Hip Internal rotation 50  45   Hip External rotation 40  45   Knee Flexion  4  4  Knee Extension  4+  4+  Ankle Dorsiflexion  4-  4+  Ankle Plantarflexion      Ankle Inversion      Ankle Eversion       (Blank rows = not tested) * pain   LUMBAR SPECIAL TESTS:  left slump  +, elys' + B, Repeated prone extension    TREATMENT DATE:  10/15/2023  Therapeutic Exercise:  Educated on anatomy and rationale behind exercises - frequency/duration of exercises and when/how to perform for  current pain presentation, on how to access and use medbridge Aerobic: Supine: Prone:  prone lying with 2 pillows--> 1 pillow --> flat then POE then prone extension - 12 minutes  Seated: with lumbar support in different chairs/position - 4 minutes  Standing: lumbar extension x10 5" holds Neuromuscular Re-education: Manual Therapy: Therapeutic Activity: Self Care: Trigger Point Dry Needling:  Modalities:     PATIENT EDUCATION:  Education details: on current presentation, on HEP, on clinical outcomes score and POC, educated on not holding breath with transitional movements  Person educated: Patient Education method: Explanation, Demonstration, and Handouts Education comprehension: verbalized understanding   HOME EXERCISE PROGRAM: [id number]  ASSESSMENT:  CLINICAL IMPRESSION: 10/15/2023  EVAL:Patient presents to PT with acute low back pain that presented a couple months ago after lifting a couch. Pain symptoms consistent with mechanical low back pain and patient responded well to extension biased program on this date. Patient presents with limitations in ROM, strength and posture that are likely contributing to current presentation and would greatly benefit from skilled PT to improve overall function and QOL.   OBJECTIVE IMPAIRMENTS: decreased activity tolerance, decreased ROM, decreased strength, improper body mechanics, postural dysfunction, and pain.   ACTIVITY LIMITATIONS: lifting, bending, standing, squatting, and sleeping  PARTICIPATION LIMITATIONS: driving and occupation  PERSONAL FACTORS: Fitness are also affecting patient's functional outcome.   REHAB POTENTIAL: Excellent  CLINICAL DECISION MAKING: Stable/uncomplicated  EVALUATION COMPLEXITY: Low   GOALS: Goals reviewed with patient? yes  SHORT TERM GOALS: Target date: 10/30/2023  Patient will be independent in self management strategies to improve quality of life and functional outcomes. Baseline: New  Program Goal status: INITIAL  2.  Patient will report at least 50% improvement in overall symptoms and/or function to demonstrate improved functional mobility Baseline: 0% better Goal status: INITIAL  3.  Patient will demonstrate painfree lumbar ROM. Baseline: painful Goal status: INITIAL       LONG TERM GOALS: Target date: 12/11/2023    Patient will report at least 75% improvement in overall symptoms and/or function to demonstrate improved functional mobility Baseline: 0% better Goal status: INITIAL  2.  Patient will score at least 2 points higher on PSFS average to demonstrate change in overall function. Baseline: see above Goal status: INITIAL  3.  Patient will demonstrate good lifting mechanics to reduce risk of injury Baseline: not currently Goal status: INITIAL  4.  Patient will report using lumbar support when sitting to reduce stress on low back.  Baseline: not currently Goal status: INITIAL   PLAN:  PT FREQUENCY: 1-2x/week for a total of 16 visits over 12 week certification period  PT DURATION: 12 weeks  PLANNED INTERVENTIONS: 97110-Therapeutic exercises, 97530- Therapeutic activity, 97112- Neuromuscular re-education, [phone]- Self Care, 60454- Manual therapy, [phone]- Gait training, [phone]- Orthotic Fit/training, [phone]- Canalith repositioning, [id number]- Aquatic Therapy, 97014- Electrical stimulation (unattended), [phone]- Ionotophoresis 4mg /ml Dexamethasone, Patient/Family education, Balance training, Stair training, Taping, Dry Needling, Joint mobilization, Joint manipulation, Spinal manipulation, Spinal mobilization, Cryotherapy, and Moist heat   PLAN FOR NEXT SESSION: measure to compression socks with outlet store info, focus on extension biased program, add in breathing exercises/core activation and hip hinge/proper lifting mechanics when able    4:03 PM, 10/15/23 Tabitha Ewings, DPT Physical Therapy with [place]

## 2023-10-22 ENCOUNTER — Ambulatory Visit: Admitting: Physical Therapy

## 2023-10-22 ENCOUNTER — Encounter: Payer: Self-pay | Admitting: Physical Therapy

## 2023-10-22 DIAGNOSIS — M6281 Muscle weakness (generalized): Secondary | ICD-10-CM

## 2023-10-22 DIAGNOSIS — M5459 Other low back pain: Secondary | ICD-10-CM

## 2023-10-22 NOTE — Therapy (Addendum)
 OUTPATIENT PHYSICAL THERAPY THORACOLUMBAR TREATMENT PHYSICAL THERAPY DISCHARGE SUMMARY  Visits from Start of Care: 2  Current functional level related to goals / functional outcomes: Could not be reassessed due to unplanned discharge   Remaining deficits: Could not be reassessed due to unplanned discharge     Education / Equipment: Could not be reassessed due to unplanned discharge     Patient agrees to discharge. Patient goals were not met. Patient is being discharged due to not returning since the last visit.  8:04 AM, 12/25/23 Olivia Church, DPT Physical Therapy with [place]   Patient Name: Grace Vazquez MRN: [id number] DOB:[birth year], 38 y.o., female Today's Date: 10/22/2023  END OF SESSION:  PT End of Session - 10/22/23 1603     Visit Number 2    Number of Visits 16    Date for PT Re-Evaluation 12/11/23    Authorization Type [employer]    PT Start Time 1605    PT Stop Time 1645    PT Time Calculation (min) 40 min    Activity Tolerance Patient tolerated treatment well    Behavior During Therapy WFL for tasks assessed/performed              Past Medical History:  Diagnosis Date   Anemia    Iron infusion 2022   Blood transfusion without reported diagnosis    given after heavy menses thought to be from COVID   Past Surgical History:  Procedure Laterality Date   DILATION AND CURETTAGE OF UTERUS     Patient Active Problem List   Diagnosis Date Noted   Class 2 severe obesity due to excess calories with serious comorbidity and body mass index (BMI) of 35.0 to 35.9 in adult ([facility]) 01/26/2021   Iron deficiency anemia due to chronic blood loss 11/02/2020   Hydrosalpinx 10/22/2019   Breakthrough bleeding associated with intrauterine device (IUD) 08/05/2019   HGSIL on cytologic smear of cervix 02/19/2019    PCP: Job Lukes, PA  REFERRING PROVIDER: Job Lukes, PA  REFERRING DIAG: M54.50 (ICD-10-CM) - Acute midline low back pain without  sciatica  Rationale for Evaluation and Treatment: Rehabilitation  THERAPY DIAG:  Other low back pain  Muscle weakness (generalized)  ONSET DATE: 1-2 months ago  SUBJECTIVE:                                                                                                                                                                                           SUBJECTIVE STATEMENT: 10/22/2023 States that her exercises are helping. States that today her ankles are bothering her. States she is still having some swelling. States she worked on Saturday the  next day she has to walk it out to keep from limping.    EVAL:States that she started having back pain in her low back after lifting a couch. States that she thought her pain would go away. States it didn't go away. In the AM it is the worse depending on how she sleeps. States she thinks sleeps in fetus position and this makes her pain worse. She also has difficulties getting up after sitting for a bit, especially after sitting in a low car.  Pain is  in the mid lower back and close to the tailbone. Hurts to bend over at times or when she moves  a certain way. States that she was brushing her teeth bending over and she had a shock of pain in her back and it was a little alarming,   States she does a lot of walking for exercise and trying to get back to healthy lifestyle.  States she also bar tends and sometimes she has ankle pain and her ankles swell. Starting with the elliptical with the occasional stretch class in regards to her current activity report.      PERTINENT HISTORY:  NA  PAIN:  Are you having pain? Yes: NPRS scale: 1/10 at worst 9/10 Pain location: right low back into tailbone Pain description: tight, sharp Aggravating factors: AM, bending, sitting in car Relieving factors: medication, heat patches, sleeping on couch in firmer position - laying flat feels better  PRECAUTIONS: None  RED FLAGS: None   WEIGHT BEARING  RESTRICTIONS: No  FALLS:  Has patient fallen in last 6 months? 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  DIAGNOSTIC FINDINGS:  lumbar xray 08/21/23 FINDINGS: There are 5 nonrib-bearing lumbar vertebrae.   Anatomic lumbar curvature.   No spondylolysis or spondylolisthesis.   Vertebral body heights are maintained.   No aggressive osseous lesion.   Intervertebral disc heights are maintained. No significant facet arthropathy or marginal osteophyte formation.   Sacroiliac joints are symmetric.   Visualized soft tissues are within normal limits.   IMPRESSION: No acute osseous abnormality of the lumbar spine.  PATIENT SURVEYS:  Patient-specific activity functional scoring scheme (Point to one number):  0 represents "unable to perform." 10 represents "able to perform at prior level. 0 1 2 3 4 5 6 7 8 9  10 (Date and Score) Activity Initial  Activity Eval     Getting up from low car/position 3     Getting out of bed in AM  1    Bending  1    Additional Additional Total score = sum of the activity scores/number of activities Minimum detectable change (90%CI) for average score = 2 points Minimum detectable change (90%CI) for single activity score = 3 points PSFS developed by: Rosalee MYRTIS Marvis KYM Charlet CHRISTELLA., & Binkley, J. (1995). Assessing disability and change on individual  patients: a report of a patient specific measure. Physiotherapy [place], 47, [phone]. Reproduced with the permission of the authors  Score: 5/3= 2.66   COGNITION: Overall cognitive status: Within functional limits for tasks assessed     SENSATION: Not tested     POSTURE: rounded shoulders and decreased lumbar lordosis  PALPATION: Tenderness to palpation along B lumbar paraspinals and right  glutes - increased resting tone on right side compared to left  side, pain with spring testing in lumbar spine but no significant restrictions noted  LUMBAR ROM:   AROM eval  Flexion 50% limited *  Extension 50% limited *  Right lateral flexion 25% limited   Left lateral flexion 25% limited   Right rotation   Left rotation    (Blank rows = not tested)  *= pain in low back    LE Measurements Lower Extremity Right EVAL Left EVAL   A/PROM MMT A/PROM MMT  Hip Flexion  3+  4  Hip Extension 5 3+* 5 4-  Hip Abduction      Hip Adduction      Hip Internal rotation 50  45   Hip External rotation 40  45   Knee Flexion  4  4  Knee Extension  4+  4+  Ankle Dorsiflexion  4-  4+  Ankle Plantarflexion      Ankle Inversion      Ankle Eversion       (Blank rows = not tested) * pain   LUMBAR SPECIAL TESTS:  left slump  +, elys' + B, Repeated prone extension    TREATMENT DATE:                                                                                                                               10/22/2023  Therapeutic Exercise:   Measured for compression socks  Reviewed entire HEP Aerobic: Supine: ankle pumps 1 minute, elevation 90/90 1 minute, SAQS on bolster 2 minutes, heel slides 2 minutes B,  Prone:     Seated:   Standing:   Neuromuscular Re-education: long exhale breathing and rationale behind exercise 3 minutes, laying over large ball --> small ball for posterior rib expansion with breath work - 8 minutes total Manual Therapy: percussion gun with padding to feet for vibration therapy - tolerated well  Therapeutic Activity: Self Care: Trigger Point Dry Needling:  Modalities:     PATIENT EDUCATION:  Education details: on HEP and use of compression garments Person educated: Patient Education method: Programmer, multimedia, Facilities manager, and Handouts Education comprehension: verbalized understanding   HOME EXERCISE PROGRAM: [id number] Long exhale breathing Laying over  small ball breathing  ASSESSMENT:  CLINICAL IMPRESSION: 10/22/2023 Focused on swelling today as this was primary complaint. Tolerated exercises well and measured patient for compression socks. Added posture training breathing exercises and edema exercises to ankles to address current complaints. Tolerated well with reduced ankle pain and no stiffness noted end of session. Will continue with current POC as tolerated.   EVAL:Patient presents to PT with acute low back pain that presented a couple months ago after lifting a couch. Pain symptoms consistent with mechanical low back pain and patient responded well to extension biased program on this date. Patient presents with limitations in ROM, strength and posture that are likely contributing to current presentation and would greatly benefit from skilled PT to improve overall function and QOL.   OBJECTIVE IMPAIRMENTS: decreased  activity tolerance, decreased ROM, decreased strength, improper body mechanics, postural dysfunction, and pain.   ACTIVITY LIMITATIONS: lifting, bending, standing, squatting, and sleeping  PARTICIPATION LIMITATIONS: driving and occupation  PERSONAL FACTORS: Fitness are also affecting patient's functional outcome.   REHAB POTENTIAL: Excellent  CLINICAL DECISION MAKING: Stable/uncomplicated  EVALUATION COMPLEXITY: Low   GOALS: Goals reviewed with patient? yes  SHORT TERM GOALS: Target date: 10/30/2023  Patient will be independent in self management strategies to improve quality of life and functional outcomes. Baseline: New Program Goal status: INITIAL  2.  Patient will report at least 50% improvement in overall symptoms and/or function to demonstrate improved functional mobility Baseline: 0% better Goal status: INITIAL  3.  Patient will demonstrate painfree lumbar ROM. Baseline: painful Goal status: INITIAL       LONG TERM GOALS: Target date: 12/11/2023    Patient will report at least 75% improvement in  overall symptoms and/or function to demonstrate improved functional mobility Baseline: 0% better Goal status: INITIAL  2.  Patient will score at least 2 points higher on PSFS average to demonstrate change in overall function. Baseline: see above Goal status: INITIAL  3.  Patient will demonstrate good lifting mechanics to reduce risk of injury Baseline: not currently Goal status: INITIAL  4.  Patient will report using lumbar support when sitting to reduce stress on low back.  Baseline: not currently Goal status: INITIAL   PLAN:  PT FREQUENCY: 1-2x/week for a total of 16 visits over 12 week certification period  PT DURATION: 12 weeks  PLANNED INTERVENTIONS: 97110-Therapeutic exercises, 97530- Therapeutic activity, 97112- Neuromuscular re-education, [phone]- Self Care, [id number]- Manual therapy, [phone]- Gait training, [phone]- Orthotic Fit/training, [phone]- Canalith repositioning, [id number]- Aquatic Therapy, 97014- Electrical stimulation (unattended), [phone]- Ionotophoresis 4mg /ml Dexamethasone, Patient/Family education, Balance training, Stair training, Taping, Dry Needling, Joint mobilization, Joint manipulation, Spinal manipulation, Spinal mobilization, Cryotherapy, and Moist heat   PLAN FOR NEXT SESSION:f/u compreesion and, focus on extension biased program, add in breathing exercises/core activation and hip hinge/proper lifting mechanics when able    4:54 PM, 10/22/23 Olivia Church, DPT Physical Therapy with 

## 2023-11-21 ENCOUNTER — Encounter: Admitting: Physical Therapy

## 2023-11-27 ENCOUNTER — Encounter: Payer: Self-pay | Admitting: Physician Assistant

## 2023-11-27 DIAGNOSIS — N926 Irregular menstruation, unspecified: Secondary | ICD-10-CM

## 2024-02-04 ENCOUNTER — Encounter: Payer: Self-pay | Admitting: Physician Assistant

## 2024-02-05 ENCOUNTER — Other Ambulatory Visit: Payer: Self-pay

## 2024-02-05 DIAGNOSIS — F4310 Post-traumatic stress disorder, unspecified: Secondary | ICD-10-CM

## 2024-02-14 ENCOUNTER — Ambulatory Visit (INDEPENDENT_AMBULATORY_CARE_PROVIDER_SITE_OTHER): Admitting: Behavioral Health

## 2024-02-14 DIAGNOSIS — F4321 Adjustment disorder with depressed mood: Secondary | ICD-10-CM | POA: Diagnosis not present

## 2024-02-14 DIAGNOSIS — F411 Generalized anxiety disorder: Secondary | ICD-10-CM

## 2024-02-14 DIAGNOSIS — F4320 Adjustment disorder, unspecified: Secondary | ICD-10-CM
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
## 2024-02-18 ENCOUNTER — Encounter: Payer: 59 | Admitting: Physician Assistant

## 2024-02-19 ENCOUNTER — Encounter: Admitting: Physician Assistant

## 2024-03-03 ENCOUNTER — Ambulatory Visit: Admitting: Behavioral Health

## 2024-03-11 ENCOUNTER — Ambulatory Visit: Admitting: Behavioral Health

## 2024-03-11 DIAGNOSIS — F4320 Adjustment disorder, unspecified: Secondary | ICD-10-CM

## 2024-03-11 DIAGNOSIS — F411 Generalized anxiety disorder: Secondary | ICD-10-CM
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
## 2024-03-26 ENCOUNTER — Ambulatory Visit: Admitting: Behavioral Health

## 2024-03-26 DIAGNOSIS — F411 Generalized anxiety disorder: Secondary | ICD-10-CM

## 2024-03-26 DIAGNOSIS — F419 Anxiety disorder, unspecified: Secondary | ICD-10-CM | POA: Diagnosis not present

## 2024-03-26 DIAGNOSIS — F4320 Adjustment disorder, unspecified: Secondary | ICD-10-CM | POA: Diagnosis not present
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
## 2024-04-08 ENCOUNTER — Ambulatory Visit: Admitting: Behavioral Health

## 2024-04-14 ENCOUNTER — Encounter: Payer: Self-pay | Admitting: Physician Assistant

## 2024-04-14 ENCOUNTER — Ambulatory Visit: Admitting: Physician Assistant

## 2024-04-14 VITALS — BP 136/80 | HR 92 | Temp 98.2°F | Ht 64.0 in | Wt 227.2 lb

## 2024-04-14 DIAGNOSIS — E669 Obesity, unspecified: Secondary | ICD-10-CM

## 2024-04-14 DIAGNOSIS — E559 Vitamin D deficiency, unspecified: Secondary | ICD-10-CM | POA: Diagnosis not present

## 2024-04-14 DIAGNOSIS — N979 Female infertility, unspecified: Secondary | ICD-10-CM | POA: Diagnosis not present

## 2024-04-14 DIAGNOSIS — R7989 Other specified abnormal findings of blood chemistry: Secondary | ICD-10-CM

## 2024-04-14 DIAGNOSIS — M25473 Effusion, unspecified ankle: Secondary | ICD-10-CM

## 2024-04-14 DIAGNOSIS — Z0000 Encounter for general adult medical examination without abnormal findings: Secondary | ICD-10-CM

## 2024-04-14 DIAGNOSIS — Z803 Family history of malignant neoplasm of breast: Secondary | ICD-10-CM

## 2024-04-14 LAB — COMPREHENSIVE METABOLIC PANEL WITH GFR
ALT: 42 U/L — ABNORMAL HIGH (ref 0–35)
AST: 34 U/L (ref 0–37)
Albumin: 4.2 g/dL (ref 3.5–5.2)
Alkaline Phosphatase: 104 U/L (ref 39–117)
BUN: 14 mg/dL (ref 6–23)
CO2: 26 meq/L (ref 19–32)
Calcium: 8.9 mg/dL (ref 8.4–10.5)
Chloride: 104 meq/L (ref 96–112)
Creatinine, Ser: 0.56 mg/dL (ref 0.40–1.20)
GFR: 117.49 mL/min (ref >60.00)
Glucose, Bld: 97 mg/dL (ref 70–99)
Potassium: 3.6 meq/L (ref 3.5–5.1)
Sodium: 137 meq/L (ref 135–145)
Total Bilirubin: 0.5 mg/dL (ref 0.2–1.2)
Total Protein: 7.6 g/dL (ref 6.0–8.3)

## 2024-04-14 LAB — VITAMIN D 25 HYDROXY (VIT D DEFICIENCY, FRACTURES): VITD: 18.31 ng/mL — ABNORMAL LOW (ref 30.00–100.00)

## 2024-04-14 NOTE — Progress Notes (Signed)
 Subjective:    Grace Vazquez is a 38 y.o. female and is here for a comprehensive physical exam.  HPI  There are no preventive care reminders to display for this patient.  Discussed the use of AI scribe software for clinical note transcription with the patient, who gave verbal consent to proceed.  History of Present Illness   Grace Vazquez is a 38 year old female who presents for follow-up on fertility issues and abnormal Pap smear history.  She is experiencing fertility issues, with her fianc having a low sperm count. Her eggs are not releasing properly despite a sufficient number. An ultrasound today showed no presence of a previously noted polyp. She is taking prenatal vitamins.  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 A follow-up colposcopy was normal. She is due for a repeat Pap smear but has faced scheduling difficulties.  Her liver function tests were slightly elevated in the past but have improved. She is considering rechecking her liver labs after a recent non-fasting blood test. Her cholesterol was slightly elevated but not to a level requiring medication. Her A1c has been normal.  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 Compression sleeves and walking help alleviate discomfort. She is considering increasing her physical activity. No chest pain or shortness of breath.  Her daughter is recovering from a leg injury and is using a cane. She is undergoing physical therapy to improve walking and posture.      Health Maintenance: Immunizations -- UpToDate  Colonoscopy -- n/a Mammogram -- N/A  PAP -- UpToDate  Bone Density -- n/a Diet -- no pork; drinks water and lemonade Exercise -- limited at times due to time  Sleep habits -- overall no major concerns Mood -- continues to work with therapy on grieving   UTD with dentist? - no UTD with eye doctor? -  no  Weight history: Wt Readings from Last 10 Encounters:  04/14/24 227 lb 4 oz (103.1 kg)  08/21/23 224 lb (101.6 kg)  02/01/20 205 lb (93 kg)  08/05/19 205 lb (93 kg)  07/29/19 203 lb 11.2 oz (92.4 kg)  02/19/19 211 lb (95.7 kg)  01/20/19 215 lb 11.2 oz (97.8 kg)  01/29/18 205 lb 11.2 oz (93.3 kg)  01/15/18 204 lb 11.2 oz (92.9 kg)  11/06/17 197 lb 6.4 oz (89.5 kg)   Body mass index is 39.01 kg/m. Patient's last menstrual period was 04/07/2024 (exact date).  Alcohol use:  reports that she does not currently use alcohol.  Tobacco use:  Tobacco Use: Low Risk  (04/14/2024)   Patient History    Smoking Tobacco Use: Never    Smokeless Tobacco Use: Never    Passive Exposure: Not on file   Eligible for lung cancer screening? no     08/21/2023    1:00 PM  Depression screen PHQ 2/9  Decreased Interest 0  Down, Depressed, Hopeless 0  PHQ - 2 Score 0     Other providers/specialists: Patient Care Team: Job Lukes, [place] as PCP - General (Physician Assistant)    PMHx, SurgHx, SocialHx, Medications, and Allergies were reviewed in the Visit Navigator and updated as appropriate.   Past Medical History:  Diagnosis Date   Anemia    Iron infusion 2022   Blood transfusion without reported diagnosis    given after heavy menses thought to be from COVID     Past Surgical History:  Procedure Laterality  Date   DILATION AND CURETTAGE OF UTERUS       Family History  Problem Relation Age of Onset   Breast cancer Maternal Aunt     Social History   Tobacco Use   Smoking status: Never   Smokeless tobacco: Never  Vaping Use   Vaping status: Never Used  Substance Use Topics   Alcohol use: Not Currently   Drug use: Never    Review of Systems:   Review of Systems  Constitutional:  Negative for chills, fever, malaise/fatigue and weight loss.  HENT:  Negative for hearing loss, sinus pain and sore throat.   Respiratory:  Negative for cough and hemoptysis.    Cardiovascular:  Negative for chest pain, palpitations, leg swelling and PND.  Gastrointestinal:  Negative for abdominal pain, constipation, diarrhea, heartburn, nausea and vomiting.  Genitourinary:  Negative for dysuria, frequency and urgency.  Musculoskeletal:  Negative for back pain, myalgias and neck pain.  Skin:  Negative for itching and rash.  Neurological:  Negative for dizziness, tingling, seizures and headaches.  Endo/Heme/Allergies:  Negative for polydipsia.  Psychiatric/Behavioral:  Negative for depression. The patient is not nervous/anxious.     Objective:   BP 136/80 (BP Location: Left Arm, Patient Position: Sitting, Cuff Size: Large)   Pulse 92   Temp 98.2 F (36.8 C) (Temporal)   Ht 5' 4 (1.626 m)   Wt 227 lb 4 oz (103.1 kg)   LMP 04/07/2024 (Exact Date)   BMI 39.01 kg/m  Body mass index is 39.01 kg/m.   General Appearance:    Alert, cooperative, no distress, appears stated age  Head:    Normocephalic, without obvious abnormality, atraumatic  Eyes:    PERRL, conjunctiva/corneas clear, EOM's intact, fundi    benign, both eyes  Ears:    Normal TM's and external ear canals, both ears  Nose:   Nares normal, septum midline, mucosa normal, no drainage    or sinus tenderness  Throat:   Lips, mucosa, and tongue normal; teeth and gums normal  Neck:   Supple, symmetrical, trachea midline, no adenopathy;    thyroid:  no enlargement/tenderness/nodules; no carotid   bruit or JVD  Back:     Symmetric, no curvature, ROM normal, no CVA tenderness  Lungs:     Clear to auscultation bilaterally, respirations unlabored  Chest Wall:    No tenderness or deformity   Heart:    Regular rate and rhythm, S1 and S2 normal, no murmur, rub or gallop  Breast Exam:    Deferred  Abdomen:     Soft, non-tender, bowel sounds active all four quadrants,    no masses, no organomegaly  Genitalia:    Deferred   Extremities:   Extremities normal, atraumatic, no cyanosis or edema  Pulses:   2+ and  symmetric all extremities  Skin:   Skin color, texture, turgor normal, no rashes or lesions  Lymph nodes:   Cervical, supraclavicular, and axillary nodes normal  Neurologic:   CNII-XII intact, normal strength, sensation and reflexes    throughout    Assessment/Plan:    Assessment and Plan    Adult Wellness Visit Routine adult wellness visit. Discussed general health maintenance, including dental and eye care, and Pap smear follow-up. Discussed lifestyle modifications to support overall health and potential pregnancy. - Encourage scheduling dental and eye doctor appointments. - Advise to follow up on Pap smear scheduling with [facility] for repeat Pap due to previous abnormal results. - Discuss dietary modifications, including reducing lemonade and rice  intake, and increasing water consumption. - Encourage regular physical activity, including walking and using the elliptical.   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 Sufficient eggs present but not releasing properly. Recent gynecological evaluation suggests using Femara and considering IUI. - Start Femara on the first day of the menstrual cycle. - Consider IUI if Femara alone is not successful. - Continue taking prenatal vitamins.  Elevated liver enzymes Previous liver enzyme levels were elevated but have improved, with one enzyme slightly elevated. - Recheck liver function tests, preferably fasting.  Obesity Weight is stable. Discussed importance of weight management in context of potential pregnancy and risk of gestational diabetes. - Encourage dietary modifications, including reducing carbohydrate intake and portion sizes. - Increase physical activity, including walking and using the elliptical.  Ankle pain and swelling Intermittent ankle pain and swelling, possibly related to weight and activity level. Symptoms improve with movement and physical therapy exercises. - Use compression  sleeves for ankle support. - Elevate ankles and perform exercises to improve blood flow. - Consider physical therapy if symptoms persist.  Vitamin D  deficiency Previously identified vitamin D  deficiency. Need for rechecking levels and possibly continuing high-dose supplementation due to potential absorption issues. - Recheck vitamin D  levels. - Consider continuing high-dose vitamin D  supplementation if levels remain low.  Genetic susceptibility to malignant neoplasm of breast Family history of breast cancer, including mother and maternal aunt. Previous genetic testing was incomplete. - Refer for genetic testing for breast cancer susceptibility.      Lucie Buttner, PA-C [facility]

## 2024-04-15 ENCOUNTER — Ambulatory Visit: Payer: Self-pay | Admitting: Physician Assistant

## 2024-04-15 DIAGNOSIS — R7989 Other specified abnormal findings of blood chemistry: Secondary | ICD-10-CM

## 2024-04-15 MED ORDER — VITAMIN D (ERGOCALCIFEROL) 1.25 MG (50000 UNIT) PO CAPS: 50000.0000 [IU] | ORAL_CAPSULE | ORAL | 0 refills | Status: AC

## 2024-04-16 ENCOUNTER — Ambulatory Visit: Admitting: Behavioral Health

## 2024-04-16 DIAGNOSIS — F4320 Adjustment disorder, unspecified: Secondary | ICD-10-CM

## 2024-04-16 DIAGNOSIS — F411 Generalized anxiety disorder: Secondary | ICD-10-CM | POA: Diagnosis not present
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
## 2024-04-21 ENCOUNTER — Encounter: Admitting: Physician Assistant

## 2024-04-30 ENCOUNTER — Ambulatory Visit: Admitting: Behavioral Health

## 2024-05-07 ENCOUNTER — Ambulatory Visit: Admitting: Behavioral Health

## 2024-05-07 DIAGNOSIS — F411 Generalized anxiety disorder: Secondary | ICD-10-CM | POA: Diagnosis not present

## 2024-05-28 ENCOUNTER — Ambulatory Visit: Admitting: Behavioral Health

## 2024-05-28 NOTE — Progress Notes (Deleted)
 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 Pt is aware of the risks/limitations of telehealth & consents to Tx today. Time In: 11:00am Time Out: 11:50am   Treatment Type: Individual Therapy  Reported Symptoms: ***  Mental Status Exam: Appearance:  {PSY:22683}     Behavior: {PSY:21022743}  Motor: {PSY:22302}  Speech/Language:  {PSY:22685}  Affect: {PSY:22687}  Mood: {PSY:31886}  Thought process: {PSY:31888}  Thought content:   {[phone]}  Sensory/Perceptual disturbances:   {PSY:856 560 2251}  Orientation: {PSY:30297}  Attention: {PSY:22877}  Concentration: {[phone]}  Memory: {PSY:743-268-7783}  Fund of knowledge:  {[phone]}  Insight:   {[phone]}  Judgment:  {PSY:([phone]}  Impulse Control: {PSY:([phone]}   Risk Assessment: Danger to Self:  {PSY:22692} Self-injurious Behavior: {PSY:22692} Danger to Others: {PSY:22692} Duty to Warn:{PSY:311194} Physical Aggression / Violence:{PSY:21197} Access to Firearms a concern: {PSY:21197} Gang Involvement:{PSY:21197}  Subjective: ***   Interventions: {[phone]}  Diagnosis:No diagnosis found.  Plan: ***  Richerd LITTIE Ling, LMFT

## 2024-05-29 ENCOUNTER — Inpatient Hospital Stay: Admitting: Genetic Counselor

## 2024-05-29 ENCOUNTER — Encounter: Payer: Self-pay | Admitting: Genetic Counselor

## 2024-05-29 ENCOUNTER — Inpatient Hospital Stay

## 2024-05-29 DIAGNOSIS — Z803 Family history of malignant neoplasm of breast: Secondary | ICD-10-CM

## 2024-05-29 DIAGNOSIS — Z8481 Family history of carrier of genetic disease: Secondary | ICD-10-CM

## 2024-05-29 NOTE — Progress Notes (Signed)
 REFERRING PROVIDER: Job Lukes, [place] [address] [place],  [place] [address]  PRIMARY PROVIDER:  Job Lukes, [place]  PRIMARY REASON FOR VISIT:  1. Family history of malignant neoplasm of breast   2. Family history of carrier of genetic disease      HISTORY OF PRESENT ILLNESS:   Grace Vazquez, a 38 y.o. female, was seen for a [place] cancer genetics consultation at the request of Job Lukes, [place] due to a family history of cancer.  Ms. Locascio presents to clinic today to discuss the possibility of a hereditary predisposition to cancer, to discuss genetic testing, and to further clarify her future cancer risks, as well as potential cancer risks for family members.   Ms. Kley is a 38 y.o. female with no personal history of cancer.    RELEVANT MEDICAL HISTORY:  Menarche was at age 73.  First live birth at age 57.  OCP use for approximately 1 years. Copper  IUD for 5 years.  Ovaries intact: yes.  Uterus intact: yes.  Menopausal status: premenopausal.  HRT use: 0 years. Colonoscopy: no; not examined. Mammogram within the last year: no. Number of breast biopsies: 0. Up to date with pelvic exams: yes. Hx abnormal pap, LEEP. History of anemia, one blood and two iron transfusions in 2022  Trying to get pregnant   Past Medical History:  Diagnosis Date   Anemia    Iron infusion 2022   Blood transfusion without reported diagnosis    given after heavy menses thought to be from COVID    Past Surgical History:  Procedure Laterality Date   DILATION AND CURETTAGE OF UTERUS      Social History   Socioeconomic History   Marital status: Single    Spouse name: Not on file   Number of children: 1   Years of education: Not on file   Highest education level: Not on file  Occupational History   Not on file  Tobacco Use   Smoking status: Never   Smokeless tobacco: Never  Vaping Use   Vaping status: Never Used  Substance and Sexual Activity   Alcohol use: Not Currently    Drug use: Never   Sexual activity: Yes    Partners: Male    Birth control/protection: None  Other Topics Concern   Not on file  Social History Narrative   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   Lives with boyfriend and 16 yo daughter (2025)   Social Drivers of Corporate Investment Banker Strain: Not on file  Food Insecurity: No Food Insecurity (11/30/2021)   [facility]   Hunger Vital Sign    Within the past 12 months, you worried that your food would run out before you got the money to buy more.: Never true    Within the past 12 months, the food you bought just didn't last and you didn't have money to get more.: Never true  Transportation Needs: Not on file  Physical Activity: Not on file  Stress: Not on file  Social Connections: Unknown (10/28/2021)   [facility]   Social Network    Social Network: Not on file     FAMILY HISTORY:  We obtained a detailed, 4-generation family history.  Significant diagnoses are listed below: Family History  Problem Relation Age of Onset   Breast cancer Mother 45   BRCA 1/2 Mother    Breast cancer Maternal Aunt 17 - 49   Breast cancer Other 50   Cancer Other  BRCA 1/2 Other     Ms. Jerry is aware of previous family history of genetic testing for hereditary cancer risks and believes her mother was diagnosed with a BRCA2 pathogenic variant, EX14_15del. Her mother had [facility] CancerNext-Expanded +RNA insight panel in 2023, which also identified a VUS in BRCA2 that was downgraded to likely benign in 2024. There is no reported Ashkenazi Jewish ancestry.     GENETIC COUNSELING ASSESSMENT: Ms. Chancellor is a 38 y.o. female with a family history of breast and pancreatic cancer and a known family history of her mother with a BRCA2 pathogenic variant, which increases her likelihood of having a hereditary predisposition to cancer. We, therefore, discussed and recommended the following at today's visit.   DISCUSSION: We discussed that 5  - 10% of cancer is hereditary, with most cases of hereditary breast cancer associated with pathogenic variants in BRCA1/2.    During the visit, we reviewed the natural history of BRCA2 mutations and increased risk for cancer including breast, female breast, ovarian, pancreatic, prostate, melanoma and possibly uterine cancer. We also discussed how the identification of a BRCA2 mutation would impact [facility] medical care, per current NCCN guidelines. We reviewed that BRCA2 mutations are inherited in an autosomal dominant pattern, meaning that children, siblings and parents of individuals with a BRCA2 mutation have a 1/2 or 50% chance to have the mutation as well. Both males and females can inherit a BRCA2 mutation and could pass it on to their children.   Additionally, for any family members who are of childbearing age, identification of a BRCA2 mutation may have reproductive implications. In rare cases a child may inherit a BRCA2 mutation from each parent and have a condition called Fanconi Anemia (FA). FA is a childhood onset condition that can be associated with developmental differences, childhood cancer and bone marrow failure, among other health concerns. Therefore, if anyone of childbearing age is found to have a BRCA2 mutation, testing of their reproductive partner should be considered to clarify the chance to have a child with FA.   We discussed that testing is beneficial for several reasons, including knowing about other cancer risks, identifying potential screening and risk-reduction options that may be appropriate, and to understanding if other family members could be at risk for cancer and allowing them to undergo genetic testing.  We reviewed the characteristics, features and inheritance patterns of hereditary cancer syndromes. We also discussed genetic testing, including the appropriate family members to test, the process of testing, insurance coverage and turn-around-time for results. We  discussed the implications of a negative, positive, carrier and/or variant of uncertain significant result. We discussed that negative results would be uninformative given that Ms. Ismael does not have a personal history of cancer. We recommended Ms. Edling pursue genetic testing for a panel that contains genes associated with breast cancer.  Ms. Mcclees was offered a common hereditary cancer panel (40+ genes) and an expanded pan-cancer panel (70+ genes). Ms. Heick was informed of the benefits and limitations of each panel, including that expanded pan-cancer panels contain several genes that do not have clear management guidelines at this point in time.  We also discussed that as the number of genes included on a panel increases, the chances of variants of uncertain significance increases. Ms. Redinger plans to consider her options and will follow up with our office to determine which test to move forward with and to schedule labs.   Based on Ms. Berish's family history of cancer, she meets medical criteria for  genetic testing. Though Ms. Docter is not personally affected, there are no affected family members that are willing/able to undergo hereditary cancer testing. Despite that she meets criteria, she may still have an out of pocket cost. We discussed that if her out of pocket cost for testing is over $100, the laboratory should contact them to discuss self-pay prices, patient pay assistance programs, if applicable, and other billing options.  We discussed that some people do not want to undergo genetic testing due to fear of genetic discrimination.  A federal law called the Genetic Information Non-Discrimination Act (GINA) of 2008 helps protect individuals against genetic discrimination based on their genetic test results.  It impacts both health insurance and employment.  With health insurance, it protects against increased premiums, being kicked off insurance or being forced to take a test in order to be insured.   For employment it protects against hiring, firing and promoting decisions based on genetic test results.  GINA does not apply to those in the [employer], those who work for companies with less than 15 employees, and new life insurance or long-term disability insurance policies.  Health status due to a cancer diagnosis is not protected under GINA.  PLAN: After considering the risks, benefits, and limitations, Ms. Alcoser provided informed consent to pursue genetic testing and the blood sample will be sent to [facility] for analysis. Results should be available within approximately 2-3 weeks' time, at which point they will be disclosed by telephone to Ms. Sundby, as will any additional recommendations warranted by these results. Ms. Dampier will receive a summary of her genetic counseling visit and a copy of her results once available. This information will also be available in Epic.   Lastly, we encouraged Ms. Schmuck to remain in contact with cancer genetics annually so that we can continuously update the family history and inform her of any changes in cancer genetics and testing that may be of benefit for this family.   Ms. Fairbairn questions were answered to her satisfaction today. Our contact information was provided should additional questions or concerns arise. Thank you for the referral and allowing us  to share in the care of your patient.   Burnard Ogren, MS, [facility] Licensed, [email or web address] phone: [phone]   I connected with  Ms. Rooks on 05/29/2024 at 8 am EDT by telephone conference and verified that I am speaking with the correct person using three identifiers.   Patient location: home Provider location: [place], [place]  60 minutes were spent on the date of the encounter in service to the patient including preparation, face-to-face consultation, documentation and care coordination.  The patient was seen alone.  Drs. Gudena and/or Lanny were available to  discuss this case as needed.  _______________________________________________________________________ For Office Staff:  Number of people involved in session: 1 Was an Intern/ student involved with case: no

## 2024-06-02 ENCOUNTER — Telehealth: Payer: Self-pay | Admitting: Genetic Counselor

## 2024-06-02 ENCOUNTER — Ambulatory Visit: Admitting: Behavioral Health

## 2024-06-02 NOTE — Telephone Encounter (Signed)
 Able to obtain copy of mother's genetic test report. Calling back to determine next steps for patient.

## 2024-06-04 ENCOUNTER — Ambulatory Visit: Admitting: Behavioral Health

## 2024-06-04 DIAGNOSIS — F4320 Adjustment disorder, unspecified: Secondary | ICD-10-CM

## 2024-06-04 DIAGNOSIS — F411 Generalized anxiety disorder: Secondary | ICD-10-CM
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
## 2024-07-01 NOTE — Telephone Encounter (Signed)
 Spoke to [place]. Will call back later today to set up time for blood work.

## 2024-07-02 ENCOUNTER — Ambulatory Visit: Admitting: Behavioral Health

## 2024-07-06 NOTE — Telephone Encounter (Signed)
 LVM with instructions on how to set up genetic testing if interested in completing still. Will need to determine which test as well. Will wait for further instruction from Ms. Francella.

## 2024-07-17 ENCOUNTER — Ambulatory Visit: Admitting: Behavioral Health

## 2024-07-17 NOTE — Progress Notes (Deleted)
"    [facility] Counselor/Therapist Progress Note  Patient ID: Grace Vazquez, MRN: [id number],    Date: 07/17/2024  Time Spent: 45 min Caregility video; Pt is __________ & Provider working remotely from [employer]. Pt is aware of the risks/limitations of telehealth & consents to Tx today. Time In: 11:00am Time Out: 11:45am   Treatment Type: Individual Therapy  Reported Symptoms: ***  Mental Status Exam: Appearance:  {PSY:22683}     Behavior: {PSY:21022743}  Motor: {PSY:22302}  Speech/Language:  {PSY:22685}  Affect: {PSY:22687}  Mood: {PSY:31886}  Thought process: {PSY:31888}  Thought content:   {PSY:925-439-4747}  Sensory/Perceptual disturbances:   {PSY:505 449 2891}  Orientation: {PSY:30297}  Attention: {PSY:22877}  Concentration: {PSY:7757634458}  Memory: {PSY:724-114-6062}  Fund of knowledge:  {PSY:7757634458}  Insight:   {PSY:7757634458}  Judgment:  {PSY:7757634458}  Impulse Control: {PSY:7757634458}   Risk Assessment: Danger to Self:  {PSY:22692} Self-injurious Behavior: {PSY:22692} Danger to Others: {PSY:22692} Duty to Warn:{PSY:311194} Physical Aggression / Violence:{PSY:21197} Access to Firearms a concern: {PSY:21197} Gang Involvement:{PSY:21197}  Subjective: ***   Interventions: {PSY:407-003-5591}  Diagnosis:No diagnosis found.  Plan: ***  Richerd LITTIE Ling, LMFT    "

## 2024-07-27 ENCOUNTER — Ambulatory Visit: Admitting: Behavioral Health

## 2024-07-27 DIAGNOSIS — F411 Generalized anxiety disorder: Secondary | ICD-10-CM | POA: Diagnosis not present

## 2024-07-27 DIAGNOSIS — F4320 Adjustment disorder, unspecified: Secondary | ICD-10-CM

## 2024-07-27 NOTE — Progress Notes (Signed)
"    [facility] Counselor/Therapist Progress Note  Patient ID: Grace Vazquez, MRN: [id number],    Date: 07/27/2024  Time Spent: 50 min Caregility video; Pt is home in private & Provider working remotely from [employer]. Pt is aware of the risks/limitations of telehealth & consents to Tx today. Time In: 3:00pm Time Out: 3:50pm   Treatment Type: Individual Therapy  Reported Symptoms: Elevated anx/dep & stress due to weather  Mental Status Exam: Appearance:  Casual     Behavior: Appropriate and Sharing  Motor: Normal  Speech/Language:  Clear and Coherent  Affect: Appropriate  Mood: normal  Thought process: normal  Thought content:   WNL  Sensory/Perceptual disturbances:   WNL  Orientation: oriented to person, place, time/date, and situation  Attention: Good  Concentration: Good  Memory: WNL  Fund of knowledge:  Good  Insight:   Good  Judgment:  Good  Impulse Control: Good   Risk Assessment: Danger to Self:  No Self-injurious Behavior: No Danger to Others: No Duty to Warn:no Physical Aggression / Violence:No  Access to Firearms a concern: No  Gang Involvement:No   Subjective: Pt will cont to support her Dtr through her Px Rehab. She is back in the [employer]. She is slowly re-entering into her 'normal' life since surgery in Dec to have a rod removed.    Interventions: Family Systems  Diagnosis:Anxiety state  Grief reaction  Plan: Grace Vazquez will remind herself about self-care & how she treats herself so she can pass on strength to her Dtr Grace Vazquez. Mom will use acronyms to define Dtr to herself using grit, strength, fortitude. She will promote her Dtr to define her Px difficulties, create goals & incorporate deadlined into stages of change & difference. She will encourage her Dtr to create a Vision Board of healing to give her strength to move forward into a future of resiliency.   Target Date: 08/09/2024  Progress: 7  Frequency: Once every 2-3  wks  Modality: Grace Richerd LITTIE Hollace, LMFT    "

## 2024-08-17 ENCOUNTER — Ambulatory Visit: Admitting: Behavioral Health
